# Patient Record
Sex: Male | Born: 1965 | Race: Black or African American | Hispanic: No | Marital: Single | Smoking: Never smoker
Health system: Southern US, Community
[De-identification: ages and names within clinical notes are randomized; demographics above are authoritative.]

## PROBLEM LIST (undated history)

## (undated) ENCOUNTER — Emergency Department (HOSPITAL_COMMUNITY): Payer: Self-pay | Source: Home / Self Care

## (undated) DIAGNOSIS — D759 Disease of blood and blood-forming organs, unspecified: Secondary | ICD-10-CM

## (undated) DIAGNOSIS — C61 Malignant neoplasm of prostate: Secondary | ICD-10-CM

## (undated) HISTORY — PX: PROSTATE SURGERY: SHX751

## (undated) HISTORY — PX: OTHER SURGICAL HISTORY: SHX169

## (undated) HISTORY — PX: KNEE ARTHROSCOPY: SUR90

---

## 1999-06-12 ENCOUNTER — Emergency Department (HOSPITAL_COMMUNITY): Admission: EM | Admit: 1999-06-12 | Discharge: 1999-06-12 | Payer: Self-pay | Admitting: Emergency Medicine

## 2002-01-20 ENCOUNTER — Emergency Department (HOSPITAL_COMMUNITY): Admission: EM | Admit: 2002-01-20 | Discharge: 2002-01-20 | Payer: Self-pay | Admitting: Emergency Medicine

## 2002-12-21 ENCOUNTER — Encounter: Payer: Self-pay | Admitting: Emergency Medicine

## 2002-12-21 ENCOUNTER — Emergency Department (HOSPITAL_COMMUNITY): Admission: EM | Admit: 2002-12-21 | Discharge: 2002-12-21 | Payer: Self-pay | Admitting: Emergency Medicine

## 2008-09-07 ENCOUNTER — Emergency Department (HOSPITAL_COMMUNITY): Admission: EM | Admit: 2008-09-07 | Discharge: 2008-09-07 | Payer: Self-pay | Admitting: Family Medicine

## 2010-06-11 LAB — POCT URINALYSIS DIP (DEVICE)
Bilirubin Urine: NEGATIVE
Glucose, UA: NEGATIVE mg/dL
Ketones, ur: NEGATIVE mg/dL
Nitrite: NEGATIVE
Protein, ur: NEGATIVE mg/dL
Specific Gravity, Urine: 1.025 (ref 1.005–1.030)
Urobilinogen, UA: 0.2 mg/dL (ref 0.0–1.0)
pH: 6 (ref 5.0–8.0)

## 2013-02-02 ENCOUNTER — Encounter (HOSPITAL_COMMUNITY): Payer: Self-pay | Admitting: Emergency Medicine

## 2013-02-02 ENCOUNTER — Emergency Department (HOSPITAL_COMMUNITY)
Admission: EM | Admit: 2013-02-02 | Discharge: 2013-02-02 | Disposition: A | Discharge status: Home / Self Care | Payer: BC Managed Care – PPO | Source: Home / Self Care

## 2013-02-02 DIAGNOSIS — N508 Other specified disorders of male genital organs: Secondary | ICD-10-CM

## 2013-02-02 DIAGNOSIS — N5089 Other specified disorders of the male genital organs: Secondary | ICD-10-CM

## 2013-02-02 NOTE — ED Notes (Signed)
Groin pain for one week.  Has back pain, but this is chronic back pain, not new.  Patient reports testicular swelling.  Denies painful urination

## 2013-02-02 NOTE — ED Provider Notes (Signed)
CSN: [id number]     Arrival date & time 02/02/13  1745 History   None    Chief Complaint  Patient presents with  . Groin Pain   (Consider location/radiation/quality/duration/timing/severity/associated sxs/prior Treatment) Patient is a 47 y.o. male presenting with groin pain. The history is provided by the patient.  Groin Pain This is a new problem. The current episode started more than 2 days ago (pt feels like his sx began after heavy lifting at work., not reported to [employer].). The problem has not changed since onset.Pertinent negatives include no chest pain and no abdominal pain.    History reviewed. No pertinent past medical history. History reviewed. No pertinent past surgical history. No family history on file. History  Substance Use Topics  . Smoking status: Never Smoker   . Smokeless tobacco: Not on file  . Alcohol Use: Yes    Review of Systems  Constitutional: Negative.   Cardiovascular: Negative for chest pain.  Gastrointestinal: Negative.  Negative for abdominal pain.  Genitourinary: Positive for scrotal swelling and testicular pain. Negative for dysuria, urgency, flank pain, discharge and penile pain.    Allergies  Review of patient's allergies indicates no known allergies.  Home Medications   Current Outpatient Rx  Name  Route  Sig  Dispense  Refill  . ibuprofen (ADVIL,MOTRIN) 200 MG tablet   Oral   Take 200 mg by mouth every 6 (six) hours as needed.          BP 153/92  Pulse 100  Temp(Src) 99.2 F (37.3 C) (Oral)  Resp 16  SpO2 100% Physical Exam  Nursing note and vitals reviewed. Constitutional: He is oriented to person, place, and time. He appears well-developed and well-nourished.  Abdominal: Soft. Bowel sounds are normal. Hernia confirmed negative in the right inguinal area and confirmed negative in the left inguinal area.  Genitourinary: Penis normal.    Right testis shows mass, swelling and tenderness. Left testis shows no mass, no  swelling and no tenderness. Circumcised.  Lymphadenopathy:       Right: No inguinal adenopathy present.       Left: No inguinal adenopathy present.  Neurological: He is alert and oriented to person, place, and time.  Skin: Skin is warm and dry.    ED Course  Procedures (including critical care time) Labs Review Labs Reviewed - No data to display Imaging Review No results found.  EKG Interpretation    Date/Time:    Ventricular Rate:    PR Interval:    QRS Duration:   QT Interval:    QTC Calculation:   R Axis:     Text Interpretation:              MDM  Scrotal mass needing urologic eval.    Linna Hoff, MD 02/02/13 2000

## 2013-02-06 ENCOUNTER — Other Ambulatory Visit (HOSPITAL_COMMUNITY): Payer: Self-pay | Admitting: Urology

## 2013-02-06 DIAGNOSIS — N5089 Other specified disorders of the male genital organs: Secondary | ICD-10-CM

## 2013-03-10 ENCOUNTER — Ambulatory Visit (HOSPITAL_COMMUNITY): Admission: RE | Admit: 2013-03-10 | Payer: BC Managed Care – PPO | Source: Ambulatory Visit

## 2014-10-13 ENCOUNTER — Emergency Department (HOSPITAL_COMMUNITY)
Admission: EM | Admit: 2014-10-13 | Discharge: 2014-10-13 | Disposition: A | Discharge status: Home / Self Care | Payer: BLUE CROSS/BLUE SHIELD | Attending: Emergency Medicine | Admitting: Emergency Medicine

## 2014-10-13 ENCOUNTER — Emergency Department (HOSPITAL_COMMUNITY): Payer: BLUE CROSS/BLUE SHIELD

## 2014-10-13 ENCOUNTER — Encounter (HOSPITAL_COMMUNITY): Payer: Self-pay | Admitting: General Surgery

## 2014-10-13 DIAGNOSIS — S71102A Unspecified open wound, left thigh, initial encounter: Secondary | ICD-10-CM | POA: Insufficient documentation

## 2014-10-13 DIAGNOSIS — Z23 Encounter for immunization: Secondary | ICD-10-CM | POA: Diagnosis not present

## 2014-10-13 DIAGNOSIS — Y999 Unspecified external cause status: Secondary | ICD-10-CM | POA: Diagnosis not present

## 2014-10-13 DIAGNOSIS — Y9289 Other specified places as the place of occurrence of the external cause: Secondary | ICD-10-CM | POA: Insufficient documentation

## 2014-10-13 DIAGNOSIS — Y9389 Activity, other specified: Secondary | ICD-10-CM | POA: Insufficient documentation

## 2014-10-13 DIAGNOSIS — Z720 Tobacco use: Secondary | ICD-10-CM | POA: Diagnosis not present

## 2014-10-13 DIAGNOSIS — W3400XA Accidental discharge from unspecified firearms or gun, initial encounter: Secondary | ICD-10-CM

## 2014-10-13 DIAGNOSIS — S71139A Puncture wound without foreign body, unspecified thigh, initial encounter: Secondary | ICD-10-CM

## 2014-10-13 LAB — PREPARE FRESH FROZEN PLASMA: Unit division: 0

## 2014-10-13 MED ORDER — HYDROCODONE-ACETAMINOPHEN 5-325 MG PO TABS: 2.0000 | ORAL_TABLET | ORAL | Status: DC | PRN

## 2014-10-13 MED ORDER — TETANUS-DIPHTH-ACELL PERTUSSIS 5-2.5-18.5 LF-MCG/0.5 IM SUSP
0.5000 mL | Freq: Once | INTRAMUSCULAR | Status: AC
  Administered 2014-10-13: 0.5 mL via INTRAMUSCULAR
  Filled 2014-10-13: qty 0.5

## 2014-10-13 MED ORDER — TETANUS-DIPHTH-ACELL PERTUSSIS 5-2.5-18.5 LF-MCG/0.5 IM SUSP: 0.5000 mL | Freq: Once | INTRAMUSCULAR | Status: DC

## 2014-10-13 MED ORDER — SODIUM CHLORIDE 0.9 % IV SOLN
INTRAVENOUS | Status: DC | PRN
  Administered 2014-10-13: 1000 mL via INTRAVENOUS

## 2014-10-13 MED ORDER — LIDOCAINE-EPINEPHRINE (PF) 2 %-1:200000 IJ SOLN
20.0000 mL | Freq: Once | INTRAMUSCULAR | Status: AC
  Administered 2014-10-13: 20 mL
  Filled 2014-10-13: qty 20
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
## 2014-10-13 NOTE — ED Notes (Signed)
Primary assessment completed on arrival at 20

## 2014-10-13 NOTE — ED Notes (Signed)
Assessment completed on arrival 1005
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
## 2014-10-13 NOTE — ED Notes (Signed)
Pt monitored by pulse ox, bp cuff, and 5-lead. 
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
## 2014-10-14 ENCOUNTER — Encounter (HOSPITAL_COMMUNITY): Payer: Self-pay | Admitting: Emergency Medicine

## 2014-10-20 ENCOUNTER — Emergency Department (HOSPITAL_COMMUNITY)
Admission: EM | Admit: 2014-10-20 | Discharge: 2014-10-20 | Disposition: A | Discharge status: Home / Self Care | Payer: Self-pay | Attending: Emergency Medicine | Admitting: Emergency Medicine

## 2014-10-20 ENCOUNTER — Encounter (HOSPITAL_COMMUNITY): Payer: Self-pay

## 2014-10-20 DIAGNOSIS — M7989 Other specified soft tissue disorders: Secondary | ICD-10-CM | POA: Insufficient documentation

## 2014-10-20 DIAGNOSIS — Z4802 Encounter for removal of sutures: Secondary | ICD-10-CM | POA: Insufficient documentation

## 2014-10-20 DIAGNOSIS — Z720 Tobacco use: Secondary | ICD-10-CM | POA: Insufficient documentation
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
## 2014-10-20 NOTE — ED Notes (Signed)
Pt left at this time with all belongings.  

## 2014-10-20 NOTE — Discharge Instructions (Signed)

## 2014-10-20 NOTE — ED Notes (Signed)
Pt states he is here for suture removal. Is concerned about swelling to R upper leg.

## 2014-10-20 NOTE — ED Notes (Signed)
  Suture removal kit at bedside 

## 2015-03-09 ENCOUNTER — Other Ambulatory Visit: Payer: Self-pay | Admitting: Gastroenterology

## 2015-03-16 ENCOUNTER — Encounter (HOSPITAL_COMMUNITY): Payer: Self-pay | Admitting: *Deleted

## 2015-03-24 ENCOUNTER — Encounter (HOSPITAL_COMMUNITY): Payer: Self-pay | Admitting: *Deleted

## 2015-03-24 ENCOUNTER — Ambulatory Visit (HOSPITAL_COMMUNITY): Payer: BLUE CROSS/BLUE SHIELD | Admitting: Certified Registered Nurse Anesthetist

## 2015-03-24 ENCOUNTER — Ambulatory Visit (HOSPITAL_COMMUNITY)
Admission: RE | Admit: 2015-03-24 | Discharge: 2015-03-24 | Disposition: A | Discharge status: Home / Self Care | Payer: BLUE CROSS/BLUE SHIELD | Source: Ambulatory Visit | Attending: Gastroenterology | Admitting: Gastroenterology

## 2015-03-24 ENCOUNTER — Encounter (HOSPITAL_COMMUNITY)
Admission: RE | Disposition: A | Discharge status: Home / Self Care | Payer: Self-pay | Source: Ambulatory Visit | Attending: Gastroenterology

## 2015-03-24 DIAGNOSIS — Z1211 Encounter for screening for malignant neoplasm of colon: Secondary | ICD-10-CM | POA: Diagnosis present

## 2015-03-24 HISTORY — PX: COLONOSCOPY WITH PROPOFOL: SHX5780

## 2015-03-24 HISTORY — DX: Disease of blood and blood-forming organs, unspecified: D75.9

## 2015-03-24 SURGERY — COLONOSCOPY WITH PROPOFOL
Anesthesia: Monitor Anesthesia Care

## 2015-03-24 MED ORDER — PROPOFOL 10 MG/ML IV BOLUS
INTRAVENOUS | Status: AC
  Filled 2015-03-24: qty 20

## 2015-03-24 MED ORDER — ONDANSETRON HCL 4 MG/2ML IJ SOLN
INTRAMUSCULAR | Status: AC
  Filled 2015-03-24: qty 2

## 2015-03-24 MED ORDER — LACTATED RINGERS IV SOLN
INTRAVENOUS | Status: DC | PRN
  Administered 2015-03-24: 11:00:00 via INTRAVENOUS

## 2015-03-24 MED ORDER — ONDANSETRON HCL 4 MG/2ML IJ SOLN
INTRAMUSCULAR | Status: DC | PRN
  Administered 2015-03-24: 4 mg via INTRAVENOUS

## 2015-03-24 MED ORDER — SODIUM CHLORIDE 0.9 % IV SOLN: INTRAVENOUS | Status: DC

## 2015-03-24 MED ORDER — PROPOFOL 500 MG/50ML IV EMUL
INTRAVENOUS | Status: DC | PRN
  Administered 2015-03-24: 300 ug/kg/min via INTRAVENOUS

## 2015-03-24 MED ORDER — PROPOFOL 10 MG/ML IV BOLUS
INTRAVENOUS | Status: AC
  Filled 2015-03-24: qty 40

## 2015-03-24 SURGICAL SUPPLY — 22 items

## 2015-03-24 NOTE — Anesthesia Preprocedure Evaluation (Signed)
Anesthesia Evaluation  Patient identified by MRN, date of birth, ID band Patient awake    Reviewed: Allergy & Precautions, NPO status , Patient's Chart, lab work & pertinent test results  Airway Mallampati: II  TM Distance: >3 FB Neck ROM: Full    Dental no notable dental hx.    Pulmonary neg pulmonary ROS,    Pulmonary exam normal breath sounds clear to auscultation       Cardiovascular Exercise Tolerance: Good negative cardio ROS Normal cardiovascular exam Rhythm:Regular Rate:Normal     Neuro/Psych negative neurological ROS  negative psych ROS   GI/Hepatic negative GI ROS, Neg liver ROS,   Endo/Other  negative endocrine ROS  Renal/GU negative Renal ROS  negative genitourinary   Musculoskeletal negative musculoskeletal ROS (+)   Abdominal   Peds negative pediatric ROS (+)  Hematology  (+) Blood dyscrasia, ,   Anesthesia Other Findings   Reproductive/Obstetrics negative OB ROS                             Anesthesia Physical Anesthesia Plan  ASA: II  Anesthesia Plan: MAC   Post-op Pain Management:    Induction: Intravenous  Airway Management Planned: Natural Airway  Additional Equipment:   Intra-op Plan:   Post-operative Plan:   Informed Consent: I have reviewed the patients History and Physical, chart, labs and discussed the procedure including the risks, benefits and alternatives for the proposed anesthesia with the patient or authorized representative who has indicated his/her understanding and acceptance.   Dental advisory given  Plan Discussed with: CRNA  Anesthesia Plan Comments:         Anesthesia Quick Evaluation

## 2015-03-24 NOTE — Discharge Instructions (Signed)

## 2015-03-24 NOTE — Anesthesia Postprocedure Evaluation (Signed)
Anesthesia Post Note  Patient: Bryan Chambers  Procedure(s) Performed: Procedure(s) (LRB): COLONOSCOPY WITH PROPOFOL (N/A)  Patient location during evaluation: PACU Anesthesia Type: MAC Level of consciousness: awake and alert Pain management: pain level controlled Vital Signs Assessment: post-procedure vital signs reviewed and stable Respiratory status: spontaneous breathing, nonlabored ventilation, respiratory function stable and patient connected to nasal cannula oxygen Cardiovascular status: stable and blood pressure returned to baseline Anesthetic complications: no    Last Vitals:  Filed Vitals:   03/24/15 1201 03/24/15 1220  BP: 94/67 153/95  Pulse: 63   Temp: 36.4 C   Resp: 18     Last Pain: There were no vitals filed for this visit.               Jaydence Vanyo J
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
## 2015-03-24 NOTE — Op Note (Signed)
Procedure: Baseline screening colonoscopy  Endoscopist: Martin Johnson  Premedication: Propofol administered by anesthesia  Procedure: The patient was placed in the left lateral decubitus position. Anal inspection and digital rectal exam were normal. The Pentax pediatric colonoscope was introduced into the rectum and advanced to the cecum. A normal-appearing appendiceal orifice was identified. A normal-appearing ileocecal valve was intubated and the terminal ileum inspected. Colonic preparation for the exam today was good. Withdrawal time was 9 minutes  Rectum. Normal. Retroflexed view of the distal rectum was normal  Sigmoid colon and descending colon. Normal  Splenic flexure. Normal  Transverse colon. Normal  Hepatic flexure. Normal  Ascending colon. Normal  Cecum and ileocecal valve. Normal  Terminal ileum. Normal  Assessment: Normal screening colonoscopy  Recommendation: Schedule repeat screening colonoscopy in 10 years 

## 2015-03-24 NOTE — Transfer of Care (Signed)
Immediate Anesthesia Transfer of Care Note  Patient: Bryan Chambers  Procedure(s) Performed: Procedure(s): COLONOSCOPY WITH PROPOFOL (N/A)  Patient Location: PACU  Anesthesia Type:MAC  Level of Consciousness:  sedated, patient cooperative and responds to stimulation  Airway & Oxygen Therapy:Patient Spontanous Breathing and Patient connected to face mask oxgen  Post-op Assessment:  Report given to PACU RN and Post -op Vital signs reviewed and stable  Post vital signs:  Reviewed and stable  Last Vitals:  Filed Vitals:   03/24/15 1100  BP: 152/100  Pulse: 67  Temp: 36.7 C  Resp: 17    Complications: No apparent anesthesia complications

## 2015-03-25 ENCOUNTER — Encounter (HOSPITAL_COMMUNITY): Payer: Self-pay | Admitting: Gastroenterology

## 2015-04-13 ENCOUNTER — Other Ambulatory Visit: Payer: Self-pay | Admitting: Urology

## 2015-06-09 ENCOUNTER — Other Ambulatory Visit (HOSPITAL_COMMUNITY): Payer: BLUE CROSS/BLUE SHIELD

## 2015-06-10 ENCOUNTER — Inpatient Hospital Stay: Admit: 2015-06-10 | Payer: BLUE CROSS/BLUE SHIELD | Admitting: Urology

## 2015-06-10 SURGERY — PROSTATECTOMY, RADICAL, ROBOT-ASSISTED, LAPAROSCOPIC
Anesthesia: General

## 2015-08-30 ENCOUNTER — Emergency Department (HOSPITAL_COMMUNITY)
Admission: EM | Admit: 2015-08-30 | Discharge: 2015-08-30 | Disposition: A | Discharge status: Home / Self Care | Payer: BLUE CROSS/BLUE SHIELD | Attending: Emergency Medicine | Admitting: Emergency Medicine

## 2015-08-30 ENCOUNTER — Ambulatory Visit (HOSPITAL_COMMUNITY)
Admission: EM | Admit: 2015-08-30 | Discharge: 2015-08-30 | Disposition: A | Discharge status: Home / Self Care | Payer: BLUE CROSS/BLUE SHIELD

## 2015-08-30 ENCOUNTER — Encounter (HOSPITAL_COMMUNITY): Payer: Self-pay | Admitting: Nurse Practitioner

## 2015-08-30 DIAGNOSIS — Y733 Surgical instruments, materials and gastroenterology and urology devices (including sutures) associated with adverse incidents: Secondary | ICD-10-CM | POA: Insufficient documentation

## 2015-08-30 DIAGNOSIS — T83098A Other mechanical complication of other indwelling urethral catheter, initial encounter: Secondary | ICD-10-CM | POA: Insufficient documentation

## 2015-08-30 DIAGNOSIS — Z79899 Other long term (current) drug therapy: Secondary | ICD-10-CM | POA: Diagnosis not present

## 2015-08-30 DIAGNOSIS — Z8546 Personal history of malignant neoplasm of prostate: Secondary | ICD-10-CM | POA: Insufficient documentation

## 2015-08-30 DIAGNOSIS — T839XXA Unspecified complication of genitourinary prosthetic device, implant and graft, initial encounter: Secondary | ICD-10-CM

## 2015-08-30 HISTORY — DX: Malignant neoplasm of prostate: C61
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
## 2015-08-30 NOTE — ED Notes (Signed)
Pt was at [facility] on 6/21 for prostate surgery. He had a foley catheter placed during procedure. Ttoday he noticed a pulling sensation around the catheter. The catheter is still in with bag attached to leg but he is not sure if it is in place. There is no urine leaking around.

## 2015-08-30 NOTE — Discharge Instructions (Signed)
Foley Catheter Care, Adult °A Foley catheter is a soft, flexible tube that is placed into the bladder to drain urine. A Foley catheter may be inserted if: °· You leak urine or are not able to control when you urinate (urinary incontinence). °· You are not able to urinate when you need to (urinary retention). °· You had prostate surgery or surgery on the genitals. °· You have certain medical conditions, such as multiple sclerosis, dementia, or a spinal cord injury. °If you are going home with a Foley catheter in place, follow the instructions below. °TAKING CARE OF THE CATHETER °1. Wash your hands with soap and water. °2. Using mild soap and warm water on a clean washcloth: °¨ Clean the area on your body closest to the catheter insertion site using a circular motion, moving away from the catheter. Never wipe toward the catheter because this could sweep bacteria up into the urethra and cause infection. °¨ Remove all traces of soap. Pat the area dry with a clean towel. For males, reposition the foreskin. °3. Attach the catheter to your leg so there is no tension on the catheter. Use adhesive tape or a leg strap. If you are using adhesive tape, remove any sticky residue left behind by the previous tape you used. °4. Keep the drainage bag below the level of the bladder, but keep it off the floor. °5. Check throughout the day to be sure the catheter is working and urine is draining freely. Make sure the tubing does not become kinked. °6. Do not pull on the catheter or try to remove it. Pulling could damage internal tissues. °TAKING CARE OF THE DRAINAGE BAGS °You will be given two drainage bags to take home. One is a large overnight drainage bag, and the other is a smaller leg bag that fits underneath clothing. You may wear the overnight bag at any time, but you should never wear the smaller leg bag at night. Follow the instructions below for how to empty, change, and clean your drainage bags. °Emptying the Drainage  Bag °You must empty your drainage bag when it is  -½ full or at least 2-3 times a day. °1. Wash your hands with soap and water. °2. Keep the drainage bag below your hips, below the level of your bladder. This stops urine from going back into the tubing and into your bladder. °3. Hold the dirty bag over the toilet or a clean container. °4. Open the pour spout at the bottom of the bag and empty the urine into the toilet or container. Do not let the pour spout touch the toilet, container, or any other surface. Doing so can place bacteria on the bag, which can cause an infection. °5. Clean the pour spout with a gauze pad or cotton ball that has rubbing alcohol on it. °6. Close the pour spout. °7. Attach the bag to your leg with adhesive tape or a leg strap. °8. Wash your hands well. °Changing the Drainage Bag °Change your drainage bag once a month or sooner if it starts to smell bad or look dirty. Below are steps to follow when changing the drainage bag. °1. Wash your hands with soap and water. °2. Pinch off the rubber catheter so that urine does not spill out. °3. Disconnect the catheter tube from the drainage tube at the connection valve. Do not let the tubes touch any surface. °4. Clean the end of the catheter tube with an alcohol wipe. Use a different alcohol wipe to clean   the end of the drainage tube. 5. Connect the catheter tube to the drainage tube of the clean drainage bag. 6. Attach the new bag to the leg with adhesive tape or a leg strap. Avoid attaching the new bag too tightly. 7. Wash your hands well. Cleaning the Drainage Bag 1. Wash your hands with soap and water. 2. Wash the bag in warm, soapy water. 3. Rinse the bag thoroughly with warm water. 4. Fill the bag with a solution of white vinegar and water (1 cup vinegar to 1 qt warm water [.2 L vinegar to 1 L warm water]). Close the bag and soak it for 30 minutes in the solution. 5. Rinse the bag with warm water. 6. Hang the bag to dry with the  pour spout open and hanging downward. 7. Store the clean bag (once it is dry) in a clean plastic bag. 8. Wash your hands well. PREVENTING INFECTION  Wash your hands before and after handling your catheter.  Take showers daily and wash the area where the catheter enters your body. Do not take baths. Replace wet leg straps with dry ones, if this applies.  Do not use powders, sprays, or lotions on the genital area. Only use creams, lotions, or ointments as directed by your caregiver.  For females, wipe from front to back after each bowel movement.  Drink enough fluids to keep your urine clear or pale yellow unless you have a fluid restriction.  Do not let the drainage bag or tubing touch or lie on the floor.  Wear cotton underwear to absorb moisture and to keep your skin drier. SEEK MEDICAL CARE IF:   Your urine is cloudy or smells unusually bad.  Your catheter becomes clogged.  You are not draining urine into the bag or your bladder feels full.  Your catheter starts to leak. SEEK IMMEDIATE MEDICAL CARE IF:   You have pain, swelling, redness, or pus where the catheter enters the body.  You have pain in the abdomen, legs, lower back, or bladder.  You have a fever.  You see blood fill the catheter, or your urine is pink or red.  You have nausea, vomiting, or chills.  Your catheter gets pulled out completely. MAKE SURE YOU:   Understand these instructions.  Will watch your condition.  Will get help right away if you are not doing well or get worse.   This information is not intended to replace advice given to you by your health care provider. Make sure you discuss any questions you have with your health care provider.   Document Released: 02/19/2005 Document Revised: 07/06/2013 Document Reviewed: 02/11/2012 Elsevier Interactive Patient Education [employer].

## 2015-08-30 NOTE — ED Notes (Signed)
Pt ambulates independently and with steady gait at time of discharge. Discharge instructions and follow up information reviewed with patient. No other questions or concerns voiced at this time.  

## 2015-08-31 ENCOUNTER — Telehealth (HOSPITAL_BASED_OUTPATIENT_CLINIC_OR_DEPARTMENT_OTHER): Payer: Self-pay | Admitting: Emergency Medicine

## 2016-10-25 ENCOUNTER — Other Ambulatory Visit (HOSPITAL_COMMUNITY): Payer: Self-pay | Admitting: Orthopedic Surgery

## 2016-10-25 ENCOUNTER — Ambulatory Visit (HOSPITAL_COMMUNITY)
Admission: RE | Admit: 2016-10-25 | Discharge: 2016-10-25 | Disposition: A | Discharge status: Home / Self Care | Payer: BLUE CROSS/BLUE SHIELD | Source: Ambulatory Visit | Attending: Orthopedic Surgery | Admitting: Orthopedic Surgery

## 2016-10-25 DIAGNOSIS — M79604 Pain in right leg: Secondary | ICD-10-CM

## 2016-10-25 DIAGNOSIS — M7989 Other specified soft tissue disorders: Secondary | ICD-10-CM | POA: Insufficient documentation

## 2016-10-25 NOTE — Progress Notes (Signed)
**  Preliminary report by tech**  Right lower extremity venous duplex complete. There is no evidence of deep or superficial vein thrombosis involving the right lower extremity. All visualized vessels appear patent and compressible.  Incidental findings are consistent with a Baker's Cyst measuring 2.8 cm high by 3.7 cm wide by greater than 4.9 cm long on the right. Results were given to Dr. French Ana.  10/25/16 3:49 PM Bryan Chambers RVT

## 2016-10-26 ENCOUNTER — Encounter (HOSPITAL_COMMUNITY): Payer: BLUE CROSS/BLUE SHIELD

## 2020-01-11 ENCOUNTER — Encounter: Payer: Self-pay | Admitting: Emergency Medicine

## 2020-01-11 ENCOUNTER — Other Ambulatory Visit: Payer: Self-pay

## 2020-01-11 ENCOUNTER — Emergency Department
Admission: EM | Admit: 2020-01-11 | Discharge: 2020-01-11 | Disposition: A | Discharge status: Home / Self Care | Payer: Self-pay | Attending: Emergency Medicine | Admitting: Emergency Medicine

## 2020-01-11 ENCOUNTER — Emergency Department: Payer: Self-pay

## 2020-01-11 DIAGNOSIS — Z8546 Personal history of malignant neoplasm of prostate: Secondary | ICD-10-CM | POA: Insufficient documentation

## 2020-01-11 DIAGNOSIS — K4091 Unilateral inguinal hernia, without obstruction or gangrene, recurrent: Secondary | ICD-10-CM | POA: Insufficient documentation

## 2020-01-11 LAB — COMPREHENSIVE METABOLIC PANEL
ALT: 58 U/L — ABNORMAL HIGH (ref 0–44)
AST: 61 U/L — ABNORMAL HIGH (ref 15–41)
Albumin: 3.8 g/dL (ref 3.5–5.0)
Alkaline Phosphatase: 60 U/L (ref 38–126)
Anion gap: 9 (ref 5–15)
BUN: 15 mg/dL (ref 6–20)
CO2: 23 mmol/L (ref 22–32)
Calcium: 9.3 mg/dL (ref 8.9–10.3)
Chloride: 102 mmol/L (ref 98–111)
Creatinine, Ser: 1.07 mg/dL (ref 0.61–1.24)
GFR, Estimated: >60 mL/min (ref >60)
Glucose, Bld: 98 mg/dL (ref 70–99)
Potassium: 3.6 mmol/L (ref 3.5–5.1)
Sodium: 134 mmol/L — ABNORMAL LOW (ref 135–145)
Total Bilirubin: 1.1 mg/dL (ref 0.3–1.2)
Total Protein: 7.4 g/dL (ref 6.5–8.1)

## 2020-01-11 LAB — CBC
HCT: 36.1 % — ABNORMAL LOW (ref 39.0–52.0)
Hemoglobin: 12.5 g/dL — ABNORMAL LOW (ref 13.0–17.0)
MCH: 31 pg (ref 26.0–34.0)
MCHC: 34.6 g/dL (ref 30.0–36.0)
MCV: 89.6 fL (ref 80.0–100.0)
Platelets: 270 10*3/uL (ref 150–400)
RBC: 4.03 MIL/uL — ABNORMAL LOW (ref 4.22–5.81)
RDW: 15.3 % (ref 11.5–15.5)
WBC: 6 10*3/uL (ref 4.0–10.5)
nRBC: 0 % (ref 0.0–0.2)

## 2020-01-11 LAB — URINALYSIS, COMPLETE (UACMP) WITH MICROSCOPIC
Bilirubin Urine: NEGATIVE
Glucose, UA: NEGATIVE mg/dL
Ketones, ur: NEGATIVE mg/dL
Nitrite: POSITIVE — AB
Protein, ur: NEGATIVE mg/dL
Specific Gravity, Urine: 1.027 (ref 1.005–1.030)
pH: 5 (ref 5.0–8.0)

## 2020-01-11 LAB — LIPASE, BLOOD: Lipase: 26 U/L (ref 11–51)

## 2020-01-11 MED ORDER — IOHEXOL 300 MG/ML  SOLN
100.0000 mL | Freq: Once | INTRAMUSCULAR | Status: AC | PRN
  Administered 2020-01-11: 100 mL via INTRAVENOUS

## 2020-01-11 NOTE — ED Triage Notes (Addendum)
Pt states that he has a hernia in upper right groin that was supposed to be repaired last month, but has been unable to.   Pt in 5/10 pain at this time. Pt denies N/V/D at this time.  Pt has hx of prostate cancer
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
## 2020-01-11 NOTE — Discharge Instructions (Signed)
Please return to the ER for reevaluation if you develop worsening pain in the right lower portion of your abdomen, fever, vomiting, or swelling in the right side of your groin that you cannot push down.  Otherwise, you should schedule follow-up with a general surgeon as well as with [facility].

## 2020-01-11 NOTE — ED Notes (Signed)
ED Provider at bedside. 

## 2020-01-11 NOTE — ED Notes (Signed)
Patient transported to CT 

## 2020-01-13 LAB — URINE CULTURE: Culture: >=100000 — AB

## 2020-01-14 LAB — URINE CULTURE

## 2020-01-15 NOTE — Progress Notes (Signed)
ED Antimicrobial Stewardship Positive Culture Follow Up   Bryan Chambers is an 54 y.o. male who presented to [facility] on 01/11/2020 with a chief complaint of right groin pain. CT scan showed small right inguinal hernia with no evidence of obstruction or strangulation. Patient denied any urinary symptoms. When patient was in ED, physician at the time decided to hold off on treatment since patient denied symptoms. Discussed with ED provider - agreed patient does not need antibiotics.    Chief Complaint  Patient presents with  . Abdominal Pain    Recent Results (from the past 720 hour(s))  Urine culture     Status: Abnormal   Collection Time: 01/11/20  4:04 PM   Specimen: Urine, Random  Result Value Ref Range Status   Specimen Description   Final    URINE, RANDOM Performed at [facility], [address], [place], [place] [address]    Special Requests   Final    NONE Performed at [facility], [address] [place], [place] [address]    Culture >=100,000 COLONIES/mL ESCHERICHIA COLI (A)  Final   Report Status 01/14/2020 FINAL  Final   Organism ID, Bacteria ESCHERICHIA COLI (A)  Final      Susceptibility   Escherichia coli - MIC*    AMPICILLIN <=2 SENSITIVE Sensitive     CEFAZOLIN <=4 SENSITIVE Sensitive     CEFEPIME <=0.12 SENSITIVE Sensitive     CEFTRIAXONE <=0.25 SENSITIVE Sensitive     CIPROFLOXACIN <=0.25 SENSITIVE Sensitive     GENTAMICIN <=1 SENSITIVE Sensitive     IMIPENEM <=0.25 SENSITIVE Sensitive     NITROFURANTOIN <=16 SENSITIVE Sensitive     TRIMETH/SULFA <=20 SENSITIVE Sensitive     AMPICILLIN/SULBACTAM <=2 SENSITIVE Sensitive     PIP/TAZO <=4 SENSITIVE Sensitive     * >=100,000 COLONIES/mL ESCHERICHIA COLI    [x]  Patient discharged originally without antimicrobial agent and treatment is not indicated  ED Provider: Dr. Waldon Merl, PharmD Pharmacy Resident  01/15/2020 9:44 AM

## 2020-02-09 ENCOUNTER — Ambulatory Visit (HOSPITAL_COMMUNITY)
Admission: EM | Admit: 2020-02-09 | Discharge: 2020-02-09 | Disposition: A | Discharge status: Home / Self Care | Payer: Self-pay | Attending: Emergency Medicine | Admitting: Emergency Medicine

## 2020-02-09 ENCOUNTER — Encounter (HOSPITAL_COMMUNITY): Payer: Self-pay

## 2020-02-09 DIAGNOSIS — S01511A Laceration without foreign body of lip, initial encounter: Secondary | ICD-10-CM

## 2020-02-09 NOTE — ED Notes (Signed)
Irrigated mouth lacerations/wounds with sterile water/ soap and rinsed with sterile water using irrigation syringe and cotton tip applicators. Pt declined dressing application 2/2 difficulty adhering dressing to lip area. Re-iterated to pt need to contact plastics/ENT STAT for wound eval.
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
## 2020-02-09 NOTE — Discharge Instructions (Addendum)
Due to the age and location of this wound I would like you to follow up with either ENT or plastic surgery, call now and notify them of your laceration which crosses the [place] border, for definitive treatment.

## 2020-06-02 ENCOUNTER — Encounter (HOSPITAL_COMMUNITY): Payer: Self-pay | Admitting: Emergency Medicine

## 2020-06-02 ENCOUNTER — Emergency Department (HOSPITAL_COMMUNITY)
Admission: EM | Admit: 2020-06-02 | Discharge: 2020-06-02 | Disposition: A | Discharge status: Home / Self Care | Payer: Self-pay | Attending: Emergency Medicine | Admitting: Emergency Medicine

## 2020-06-02 DIAGNOSIS — M549 Dorsalgia, unspecified: Secondary | ICD-10-CM

## 2020-06-02 DIAGNOSIS — Z8546 Personal history of malignant neoplasm of prostate: Secondary | ICD-10-CM | POA: Insufficient documentation

## 2020-06-02 DIAGNOSIS — Y92410 Unspecified street and highway as the place of occurrence of the external cause: Secondary | ICD-10-CM | POA: Insufficient documentation

## 2020-06-02 DIAGNOSIS — M542 Cervicalgia: Secondary | ICD-10-CM

## 2020-06-02 MED ORDER — METHOCARBAMOL 500 MG PO TABS: 500.0000 mg | ORAL_TABLET | Freq: Every evening | ORAL | 0 refills | Status: DC | PRN

## 2020-06-02 MED ORDER — NAPROXEN 500 MG PO TABS: 500.0000 mg | ORAL_TABLET | Freq: Two times a day (BID) | ORAL | 0 refills | Status: DC

## 2020-06-02 NOTE — Discharge Instructions (Addendum)
Take naproxen 2 times a day with meals.  Do not take other anti-inflammatories at the same time (Advil, Motrin, ibuprofen, Aleve). You may supplement with Tylenol if you need further pain control. Use robaxin as needed for muscle stiffness or soreness.  Have caution, this may make you tired or groggy.  Do not drive or operate heavy machinery while taking this medicine. Use ice packs or heating pads if this helps control your pain. Use muscle creams/patches as needed for pain control. You will likely have continued muscle stiffness and soreness over the next couple days.  Follow-up with primary care in 1 week if your symptoms are not improving. Return to the emergency room if you develop vision changes, vomiting, slurred speech, numbness, loss of bowel or bladder control, or any new or worsening symptoms.
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
## 2020-11-14 ENCOUNTER — Emergency Department (HOSPITAL_BASED_OUTPATIENT_CLINIC_OR_DEPARTMENT_OTHER)
Admission: EM | Admit: 2020-11-14 | Discharge: 2020-11-14 | Disposition: A | Discharge status: Home / Self Care | Payer: 59 | Attending: Emergency Medicine | Admitting: Emergency Medicine

## 2020-11-14 ENCOUNTER — Encounter (HOSPITAL_BASED_OUTPATIENT_CLINIC_OR_DEPARTMENT_OTHER): Payer: Self-pay

## 2020-11-14 ENCOUNTER — Other Ambulatory Visit: Payer: Self-pay

## 2020-11-14 DIAGNOSIS — Z8546 Personal history of malignant neoplasm of prostate: Secondary | ICD-10-CM | POA: Insufficient documentation

## 2020-11-14 DIAGNOSIS — R2241 Localized swelling, mass and lump, right lower limb: Secondary | ICD-10-CM | POA: Diagnosis not present

## 2020-11-14 DIAGNOSIS — M25562 Pain in left knee: Secondary | ICD-10-CM | POA: Diagnosis present

## 2020-11-14 MED ORDER — HYDROCODONE-ACETAMINOPHEN 5-325 MG PO TABS: 1.0000 | ORAL_TABLET | Freq: Four times a day (QID) | ORAL | 0 refills | Status: DC | PRN

## 2020-11-14 MED ORDER — HYDROCODONE-ACETAMINOPHEN 5-325 MG PO TABS
1.0000 | ORAL_TABLET | Freq: Once | ORAL | Status: AC
  Administered 2020-11-14: 1 via ORAL
  Filled 2020-11-14: qty 1

## 2020-11-14 MED ORDER — PREDNISONE 10 MG (21) PO TBPK: ORAL_TABLET | ORAL | 0 refills | Status: DC

## 2020-11-14 MED ORDER — COLCHICINE 0.6 MG PO TABS: ORAL_TABLET | ORAL | 0 refills | Status: DC

## 2020-11-14 NOTE — ED Notes (Signed)
Pt with knee pain x 2 weeks. Denies any injury or trauma. Pt states the swelling has decreased some. He has been taking nsaids and icing it with no relief. Pt alert & oriented, nad noted.
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
## 2021-01-20 ENCOUNTER — Emergency Department (HOSPITAL_BASED_OUTPATIENT_CLINIC_OR_DEPARTMENT_OTHER)
Admission: EM | Admit: 2021-01-20 | Discharge: 2021-01-20 | Disposition: A | Discharge status: Home / Self Care | Payer: 59 | Attending: Emergency Medicine | Admitting: Emergency Medicine

## 2021-01-20 ENCOUNTER — Emergency Department (HOSPITAL_BASED_OUTPATIENT_CLINIC_OR_DEPARTMENT_OTHER): Payer: 59

## 2021-01-20 ENCOUNTER — Other Ambulatory Visit: Payer: Self-pay

## 2021-01-20 DIAGNOSIS — M25462 Effusion, left knee: Secondary | ICD-10-CM | POA: Diagnosis not present

## 2021-01-20 DIAGNOSIS — Z8546 Personal history of malignant neoplasm of prostate: Secondary | ICD-10-CM | POA: Insufficient documentation

## 2021-01-20 DIAGNOSIS — M25562 Pain in left knee: Secondary | ICD-10-CM

## 2021-01-20 MED ORDER — KETOROLAC TROMETHAMINE 15 MG/ML IJ SOLN
15.0000 mg | Freq: Once | INTRAMUSCULAR | Status: AC
  Administered 2021-01-20: 15 mg via INTRAMUSCULAR
  Filled 2021-01-20: qty 1

## 2021-01-20 MED ORDER — ETODOLAC 300 MG PO CAPS: 300.0000 mg | ORAL_CAPSULE | Freq: Two times a day (BID) | ORAL | 0 refills | Status: AC

## 2021-01-20 NOTE — ED Notes (Signed)
Pt d/c home per MD order. Discharge summary reviewed with pt, pt verbalizes understanding. Ambulatory off unit. No s/s of acute distress noted at discharge.,  °
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
## 2021-01-20 NOTE — Discharge Instructions (Signed)
Take etodolac as directed   Follow up with orthopedics  Please return to the emergency department for any new or worsening symptoms.

## 2021-01-20 NOTE — ED Triage Notes (Incomplete)
Pt c/o left knee pain x2 months. Seen here previously for same, dx with gout. Reports no alleviating factors. Reports gout meds prescribed previously did not help. Denies injury or recent falls to area.

## 2021-03-16 ENCOUNTER — Other Ambulatory Visit: Payer: Self-pay | Admitting: Family Medicine

## 2021-03-16 ENCOUNTER — Ambulatory Visit (INDEPENDENT_AMBULATORY_CARE_PROVIDER_SITE_OTHER): Payer: 59

## 2021-03-16 ENCOUNTER — Ambulatory Visit: Payer: 59 | Admitting: Family Medicine

## 2021-03-16 ENCOUNTER — Ambulatory Visit (INDEPENDENT_AMBULATORY_CARE_PROVIDER_SITE_OTHER): Payer: 59 | Admitting: Family Medicine

## 2021-03-16 ENCOUNTER — Ambulatory Visit: Payer: Self-pay

## 2021-03-16 ENCOUNTER — Ambulatory Visit: Payer: 59

## 2021-03-16 ENCOUNTER — Other Ambulatory Visit: Payer: Self-pay

## 2021-03-16 VITALS — BP 162/98 | HR 95 | Ht 69.0 in | Wt 189.6 lb

## 2021-03-16 DIAGNOSIS — M25551 Pain in right hip: Secondary | ICD-10-CM

## 2021-03-16 DIAGNOSIS — M25562 Pain in left knee: Secondary | ICD-10-CM | POA: Diagnosis not present

## 2021-03-16 DIAGNOSIS — G8929 Other chronic pain: Secondary | ICD-10-CM

## 2021-03-16 LAB — BASIC METABOLIC PANEL
BUN: 8 mg/dL (ref 6–23)
CO2: 27 mEq/L (ref 19–32)
Calcium: 9.6 mg/dL (ref 8.4–10.5)
Chloride: 104 mEq/L (ref 96–112)
Creatinine, Ser: 1.24 mg/dL (ref 0.40–1.50)
GFR: 64.6 mL/min (ref >60.00)
Glucose, Bld: 92 mg/dL (ref 70–99)
Potassium: 3.9 mEq/L (ref 3.5–5.1)
Sodium: 138 mEq/L (ref 135–145)

## 2021-03-16 LAB — URIC ACID: Uric Acid, Serum: 8 mg/dL — ABNORMAL HIGH (ref 4.0–7.8)

## 2021-03-16 MED ORDER — COLCHICINE 0.6 MG PO TABS: 0.6000 mg | ORAL_TABLET | Freq: Every day | ORAL | 1 refills | Status: DC

## 2021-03-16 NOTE — Patient Instructions (Addendum)
Thank you for coming in today.   Please get an Xray today before you leave   Please get labs today before you leave   You received an injection today. Seek immediate medical attention if the joint becomes red, extremely painful, or is oozing fluid.   I've sent the colchicine into your pharmacy.  Please complete the exercises that the athletic trainer went over with you:  View at [email or web address] using code: [email or web address]
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
## 2021-03-17 MED ORDER — ALLOPURINOL 300 MG PO TABS: 300.0000 mg | ORAL_TABLET | Freq: Every day | ORAL | 3 refills | Status: AC

## 2021-03-17 NOTE — Progress Notes (Signed)
Uric acid level is 8.0.  This is elevated indicating gout.  Starting allopurinol.  Take this medication daily to prevent future gout episodes.

## 2021-03-17 NOTE — Addendum Note (Signed)
Addended by: Gregor Hams on: 03/17/2021 07:45 AM   Modules accepted: Orders

## 2021-03-17 NOTE — Progress Notes (Signed)
Right hip x-ray shows some severe arthritis changes of the hip joint.  This is causing some of the hip pain.  The exercises that we taught you could help as well.  If not improved hip replacement may be helpful.

## 2021-03-20 NOTE — Progress Notes (Signed)
Fluid analysis of the fluid pulled off of your knee indicates that it probably was not gout causing your knee pain.

## 2021-03-22 LAB — ANAEROBIC AND AEROBIC CULTURE
AER RESULT:: NO GROWTH
GRAM STAIN:: NONE SEEN
MICRO NUMBER:: 12863109
MICRO NUMBER:: 12863110
SPECIMEN QUALITY:: ADEQUATE
SPECIMEN QUALITY:: ADEQUATE

## 2021-03-22 LAB — SYNOVIAL FLUID ANALYSIS, COMPLETE
Basophils, %: 0 %
Eosinophils-Synovial: 0 % (ref 0–2)
Lymphocytes-Synovial Fld: 90 % — ABNORMAL HIGH (ref 0–74)
Monocyte/Macrophage: 8 % (ref 0–69)
Neutrophil, Synovial: 2 % (ref 0–24)
Synoviocytes, %: 0 % (ref 0–15)
WBC, Synovial: 467 cells/uL — ABNORMAL HIGH (ref <150)

## 2021-04-05 ENCOUNTER — Other Ambulatory Visit: Payer: Self-pay

## 2021-04-05 ENCOUNTER — Ambulatory Visit (INDEPENDENT_AMBULATORY_CARE_PROVIDER_SITE_OTHER): Payer: 59

## 2021-04-05 ENCOUNTER — Ambulatory Visit: Payer: Self-pay

## 2021-04-05 ENCOUNTER — Ambulatory Visit (INDEPENDENT_AMBULATORY_CARE_PROVIDER_SITE_OTHER): Payer: 59 | Admitting: Family Medicine

## 2021-04-05 VITALS — BP 150/90 | HR 83 | Ht 69.0 in | Wt 180.6 lb

## 2021-04-05 DIAGNOSIS — G8929 Other chronic pain: Secondary | ICD-10-CM | POA: Diagnosis not present

## 2021-04-05 DIAGNOSIS — M25521 Pain in right elbow: Secondary | ICD-10-CM

## 2021-04-05 DIAGNOSIS — M25551 Pain in right hip: Secondary | ICD-10-CM | POA: Diagnosis not present

## 2021-04-05 NOTE — Progress Notes (Signed)
I, Peterson Lombard, LAT, ATC acting as a scribe for Lynne Leader, MD.  Bryan Chambers is a 56 y.o. male who presents to [place] [facility] today for f/u L knee pain due to DJD and gout and R hip pain. Pt was last seen by Dr. Georgina Snell 03/16/21 and his L knee was aspirated and steroid injected and he was prescribed colchicine and allopurinol. Pt was taught HEP for hip abductor strengthening. Today, pt reports the L knee has some good days and bad. Pt c/o increase pain in his R hip. Pt has new R elbow pain w/ stiffness esp in the morning. Pt locates pain to the posterior aspect of the R elbow. Pt locates hip pain to the anterior aspect and occasionally along the R lateral and buttock w/ increased pain on stairs. Pt c/o R hip feels like it going to give out.  Dx testing: 03/16/21 Labs & culture 03/16/21 R hip XR 01/20/21 L knee XR  Pertinent review of systems: No fevers or chills  Relevant historical information: Gout   Exam:  BP (!) 150/90    Pulse 83    Ht 5\' 9"  (1.753 m)    Wt 180 lb 9.6 oz (81.9 kg)    SpO2 98%    BMI 26.67 kg/m  General: Well Developed, well nourished, and in no acute distress.   MSK:  Right hip normal-appearing Significantly limited range of motion to internal rotation of flexion producing pain. Mild tender palpation greater trochanter.  Right elbow normal-appearing Not particularly tender. Normal range of motion. Intact strength to elbow extension and flexion. Minimal pain to resisted wrist extension or flexion. Pulses cap refill and sensation are intact distally.    Lab and Radiology Results  Diagnostic Limited MSK Ultrasound of: Right elbow Lateral epicondyle.  No tear or severe tendinopathy of the extensor tendon origin. Medial epicondyle no tear or significant tendinopathy of the common flexor tendon origin. Minimal joint effusion present at lateral elbow. Impression: Mild joint effusion otherwise relatively normal ultrasound appearance  right elbow.  X-ray images right elbow obtained today personally and independently interpreted Minimal degenerative changes.  No acute fractures.  Osteophyte olecranon. Await formal radiology review   EXAM: DG HIP (WITH OR WITHOUT PELVIS) 2-3V RIGHT   COMPARISON:  CT abdomen pelvis dated 01/11/2020   FINDINGS: There is no acute fracture or dislocation. Moderate left and severe right hip arthritic changes. There is complete loss of superior right hip joint space with bone on bone contact. The bones are osteopenic. Degenerative changes of the lower lumbar spine. The soft tissues are unremarkable.   IMPRESSION: 1. No acute fracture or dislocation. 2. Severe arthritic changes of the right hip.     Electronically Signed   By: Anner Crete M.D.   On: 03/17/2021 02:00 I, Lynne Leader, personally (independently) visualized and performed the interpretation of the images attached in this note.   Assessment and Plan: 56 y.o. male with  Right hip pain.  Primary source of hip pain is due to the significant hip DJD seen on x-ray.  At this point he is unable to work because of the hip pain.  Not optimistic that any conservative measures strategies will be successful in steroid injections will very likely delay hip replacement.  Proceed with direct referral to orthopedic surgery for surgical consultation.  He is agreeable to with this plan.  Right elbow pain: Etiology is less clear.  He has more diffuse elbow pain without clear tendinopathy.  Discussed  options.  Offered steroid injection.  He declined.  Watchful waiting recheck in a month.  Recommend Voltaren gel.   PDMP not reviewed this encounter. Orders Placed This Encounter  Procedures   [place] LIMITED JOINT SPACE STRUCTURES LOW RIGHT(NO LINKED CHARGES)    Standing Status:   Future    Number of Occurrences:   1    Standing Expiration Date:   10/03/2021    Order Specific Question:   Reason for Exam (SYMPTOM  OR DIAGNOSIS REQUIRED)     Answer:   right hip pain    Order Specific Question:   Preferred imaging location?    Answer:   [place]   DG ELBOW COMPLETE RIGHT (3+VIEW)    Standing Status:   Future    Number of Occurrences:   1    Standing Expiration Date:   04/05/2022    Order Specific Question:   Reason for Exam (SYMPTOM  OR DIAGNOSIS REQUIRED)    Answer:   right elbow pain    Order Specific Question:   Preferred imaging location?    Answer:   [facility]   Ambulatory referral to Orthopedic Surgery    Referral Priority:   Routine    Referral Type:   Surgical    Referral Reason:   Specialty Services Required    Requested Specialty:   Orthopedic Surgery    Number of Visits Requested:   1   No orders of the defined types were placed in this encounter.    Discussed warning signs or symptoms. Please see discharge instructions. Patient expresses understanding.   The above documentation has been reviewed and is accurate and complete Lynne Leader, M.D.

## 2021-04-05 NOTE — Patient Instructions (Addendum)
Thank you for coming in today.   I've referred you to [facility] for surgical consultation of your right hip  Please get an Xray today before you leave   Recheck elbow in 1 month

## 2021-04-06 NOTE — Progress Notes (Signed)
Elbow x-ray shows a little bit of arthritis.  Please try restarting colchicine for gout.  If not better I can do an injection.

## 2021-04-18 ENCOUNTER — Other Ambulatory Visit: Payer: Self-pay

## 2021-04-18 ENCOUNTER — Ambulatory Visit (INDEPENDENT_AMBULATORY_CARE_PROVIDER_SITE_OTHER): Payer: 59 | Admitting: Orthopaedic Surgery

## 2021-04-18 ENCOUNTER — Encounter: Payer: Self-pay | Admitting: Orthopaedic Surgery

## 2021-04-18 VITALS — Ht 69.0 in | Wt 185.0 lb

## 2021-04-18 DIAGNOSIS — M1611 Unilateral primary osteoarthritis, right hip: Secondary | ICD-10-CM | POA: Diagnosis not present
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
## 2021-05-11 ENCOUNTER — Telehealth: Payer: Self-pay

## 2021-05-11 NOTE — Telephone Encounter (Signed)
Patient came into the office stating that he fell in his yard and is having a lot of pain.  He would like some paper work stating his wellbeing so he can give to his attorney he stated that he is having issues with disability.  He would like to be seen or called back as soon as possible  ?

## 2021-05-11 NOTE — Telephone Encounter (Signed)
Note made.  

## 2021-05-11 NOTE — Telephone Encounter (Signed)
Please write a letter stating that he has advanced right hip degenerative joint disease and will need a hip replacement in the near future.  Thank you.

## 2021-05-12 NOTE — Telephone Encounter (Signed)
Called patient no answer LMOM.

## 2021-05-15 ENCOUNTER — Other Ambulatory Visit: Payer: Self-pay

## 2021-05-18 ENCOUNTER — Telehealth: Payer: Self-pay | Admitting: Orthopaedic Surgery

## 2021-05-18 NOTE — Telephone Encounter (Signed)
Pt called requesting a letter be drawn up to take to his disability hearing on Monday. Pt need letter to state he will be having surgery 06/05/21. He is asking to pick letter up today if possible. Please call pt about this matter at [phone]. ?

## 2021-05-19 NOTE — Telephone Encounter (Signed)
Patient aware.

## 2021-05-19 NOTE — Telephone Encounter (Signed)
Ready for pick up

## 2021-05-29 NOTE — Pre-Procedure Instructions (Addendum)
Surgical Instructions ? ? ? Your procedure is scheduled on Monday, April 3rd. ? Report to [facility] Main Entrance "A" at 12:30 P.M., then check in with the Admitting office. ? Call this number if you have problems the morning of surgery: ? [phone] ? ? If you have any questions prior to your surgery date call [phone]: Open Monday-Friday 8am-4pm ? ? ? Remember: ? Do not eat after midnight the night before your surgery ? ?You may drink clear liquids until 11:30 a.m. the morning of your surgery.   ?Clear liquids allowed are: Water, Non-Citrus Juices (without pulp), Carbonated Beverages, Clear Tea, Black Coffee Only (NO MILK, CREAM OR POWDERED CREAMER of any kind), and Gatorade. ? ? ?Enhanced Recovery after Surgery for Orthopedics ?Enhanced Recovery after Surgery is a protocol used to improve the stress on your body and your recovery after surgery. ? ?Patient Instructions ? ?The day of surgery (if you do NOT have diabetes):  ?Drink ONE (1) Pre-Surgery Clear Ensure by 11:30 am the morning of surgery   ?This drink was given to you during your hospital  ?pre-op appointment visit. ?Nothing else to drink after completing the  ?Pre-Surgery Clear Ensure. ? ?       If you have questions, please contact your surgeon?s office. ? ?  ? Take these medicines the morning of surgery with A SIP OF WATER  ?allopurinol (ZYLOPRIM) ?Colchicine ? ?As of today, STOP taking any Aspirin (unless otherwise instructed by your surgeon) Aleve, Naproxen, Ibuprofen, Motrin, Advil, Goody's, BC's, all herbal medications, fish oil, and all vitamins. ?         ?           ?Do NOT Smoke (Tobacco/Vaping) for 24 hours prior to your procedure. ? ?If you use a CPAP at night, you may bring your mask/headgear for your overnight stay. ?  ?Contacts, glasses, piercing's, hearing aid's, dentures or partials may not be worn into surgery, please bring cases for these belongings.  ?  ?For patients admitted to the hospital, discharge time will be determined by  your treatment team. ?  ?Patients discharged the day of surgery will not be allowed to drive home, and someone needs to stay with them for 24 hours. ? ?SURGICAL WAITING ROOM VISITATION ?Patients having surgery or a procedure may have two support people in the waiting room. These visitors may be switched out with other visitors if needed. ?Children under the age of 29 must have an adult accompany them who is not the patient. ?If the patient needs to stay at the hospital during part of their recovery, the visitor guidelines for inpatient rooms apply. ? ?Please refer to the [facility] website for the visitor guidelines for Inpatients (after your surgery is over and you are in a regular room).  ? ? ?Special instructions:   ?[facility]- Preparing For Surgery ? ?Before surgery, you can play an important role. Because skin is not sterile, your skin needs to be as free of germs as possible. You can reduce the number of germs on your skin by washing with CHG (chlorahexidine gluconate) Soap before surgery.  CHG is an antiseptic cleaner which kills germs and bonds with the skin to continue killing germs even after washing.   ? ?Oral Hygiene is also important to reduce your risk of infection.  Remember - BRUSH YOUR TEETH THE MORNING OF SURGERY WITH YOUR REGULAR TOOTHPASTE ? ?Please do not use if you have an allergy to CHG or antibacterial soaps. If your skin becomes  reddened/irritated stop using the CHG.  ?Do not shave (including legs and underarms) for at least 48 hours prior to first CHG shower. It is OK to shave your face. ? ?Please follow these instructions carefully. ?  ?Shower the NIGHT BEFORE SURGERY and the MORNING OF SURGERY ? ?If you chose to wash your hair, wash your hair first as usual with your normal shampoo. ? ?After you shampoo, rinse your hair and body thoroughly to remove the shampoo. ? ?Use CHG Soap as you would any other liquid soap. You can apply CHG directly to the skin and wash gently with a scrungie or  a clean washcloth.  ? ?Apply the CHG Soap to your body ONLY FROM THE NECK DOWN.  Do not use on open wounds or open sores. Avoid contact with your eyes, ears, mouth and genitals (private parts). Wash Face and genitals (private parts)  with your normal soap.  ? ?Wash thoroughly, paying special attention to the area where your surgery will be performed. ? ?Thoroughly rinse your body with warm water from the neck down. ? ?DO NOT shower/wash with your normal soap after using and rinsing off the CHG Soap. ? ?Pat yourself dry with a CLEAN TOWEL. ? ?Wear CLEAN PAJAMAS to bed the night before surgery ? ?Place CLEAN SHEETS on your bed the night before your surgery ? ?DO NOT SLEEP WITH PETS. ? ? ?Day of Surgery: ?Take a shower with CHG soap. ?Do not wear jewelry. ?Do not wear lotions, powders, colognes, or deodorant. ?Men may shave face and neck. ?Do not bring valuables to the hospital.  ?[facility] is not responsible for any belongings or valuables. ?Wear clean/comfortable clothing to the hospital. ?Do not apply any deodorants or lotions. ?Remember to brush your teeth WITH YOUR REGULAR TOOTHPASTE. ?  ?Please read over the following fact sheets that you were given. ? ? ? ?If you received a COVID test during your pre-op visit  it is requested that you wear a mask when out in public, stay away from anyone that may not be feeling well and notify your surgeon if you develop symptoms. If you have been in contact with anyone that has tested positive in the last 10 days please notify you surgeon. ? ?

## 2021-05-30 ENCOUNTER — Encounter: Payer: Self-pay | Admitting: Emergency Medicine

## 2021-05-30 ENCOUNTER — Encounter (HOSPITAL_COMMUNITY)
Admission: RE | Admit: 2021-05-30 | Discharge: 2021-05-30 | Disposition: A | Discharge status: Home / Self Care | Payer: Commercial Managed Care - HMO | Source: Ambulatory Visit | Attending: Orthopaedic Surgery | Admitting: Orthopaedic Surgery

## 2021-05-30 ENCOUNTER — Other Ambulatory Visit: Payer: Self-pay

## 2021-05-30 ENCOUNTER — Encounter (HOSPITAL_COMMUNITY): Payer: Self-pay

## 2021-05-30 ENCOUNTER — Ambulatory Visit (INDEPENDENT_AMBULATORY_CARE_PROVIDER_SITE_OTHER): Payer: 59 | Admitting: Emergency Medicine

## 2021-05-30 VITALS — BP 152/98 | HR 85 | Temp 98.3°F | Resp 17 | Ht 69.0 in | Wt 186.7 lb

## 2021-05-30 VITALS — BP 138/90 | HR 81 | Temp 98.5°F | Ht 69.0 in | Wt 186.5 lb

## 2021-05-30 DIAGNOSIS — Z7689 Persons encountering health services in other specified circumstances: Secondary | ICD-10-CM

## 2021-05-30 DIAGNOSIS — Z8739 Personal history of other diseases of the musculoskeletal system and connective tissue: Secondary | ICD-10-CM | POA: Diagnosis not present

## 2021-05-30 DIAGNOSIS — M7021 Olecranon bursitis, right elbow: Secondary | ICD-10-CM

## 2021-05-30 DIAGNOSIS — Z8546 Personal history of malignant neoplasm of prostate: Secondary | ICD-10-CM

## 2021-05-30 DIAGNOSIS — A4902 Methicillin resistant Staphylococcus aureus infection, unspecified site: Secondary | ICD-10-CM | POA: Diagnosis not present

## 2021-05-30 DIAGNOSIS — Z01812 Encounter for preprocedural laboratory examination: Secondary | ICD-10-CM | POA: Insufficient documentation

## 2021-05-30 DIAGNOSIS — M1611 Unilateral primary osteoarthritis, right hip: Secondary | ICD-10-CM | POA: Insufficient documentation

## 2021-05-30 DIAGNOSIS — Z01818 Encounter for other preprocedural examination: Secondary | ICD-10-CM

## 2021-05-30 LAB — CBC WITH DIFFERENTIAL/PLATELET
Abs Immature Granulocytes: 0.02 10*3/uL (ref 0.00–0.07)
Basophils Absolute: 0 10*3/uL (ref 0.0–0.1)
Basophils Relative: 1 %
Eosinophils Absolute: 0.1 10*3/uL (ref 0.0–0.5)
Eosinophils Relative: 1 %
HCT: 38.1 % — ABNORMAL LOW (ref 39.0–52.0)
Hemoglobin: 12 g/dL — ABNORMAL LOW (ref 13.0–17.0)
Immature Granulocytes: 0 %
Lymphocytes Relative: 37 %
Lymphs Abs: 1.7 10*3/uL (ref 0.7–4.0)
MCH: 29.9 pg (ref 26.0–34.0)
MCHC: 31.5 g/dL (ref 30.0–36.0)
MCV: 95 fL (ref 80.0–100.0)
Monocytes Absolute: 0.4 10*3/uL (ref 0.1–1.0)
Monocytes Relative: 8 %
Neutro Abs: 2.5 10*3/uL (ref 1.7–7.7)
Neutrophils Relative %: 53 %
Platelets: 285 10*3/uL (ref 150–400)
RBC: 4.01 MIL/uL — ABNORMAL LOW (ref 4.22–5.81)
RDW: 14.5 % (ref 11.5–15.5)
WBC: 4.7 10*3/uL (ref 4.0–10.5)
nRBC: 0 % (ref 0.0–0.2)

## 2021-05-30 LAB — COMPREHENSIVE METABOLIC PANEL
ALT: 61 U/L — ABNORMAL HIGH (ref 0–44)
AST: 65 U/L — ABNORMAL HIGH (ref 15–41)
Albumin: 3.6 g/dL (ref 3.5–5.0)
Alkaline Phosphatase: 75 U/L (ref 38–126)
Anion gap: 5 (ref 5–15)
BUN: 7 mg/dL (ref 6–20)
CO2: 28 mmol/L (ref 22–32)
Calcium: 9.6 mg/dL (ref 8.9–10.3)
Chloride: 107 mmol/L (ref 98–111)
Creatinine, Ser: 0.92 mg/dL (ref 0.61–1.24)
GFR, Estimated: >60 mL/min (ref >60)
Glucose, Bld: 106 mg/dL — ABNORMAL HIGH (ref 70–99)
Potassium: 3.8 mmol/L (ref 3.5–5.1)
Sodium: 140 mmol/L (ref 135–145)
Total Bilirubin: 0.2 mg/dL — ABNORMAL LOW (ref 0.3–1.2)
Total Protein: 6.7 g/dL (ref 6.5–8.1)

## 2021-05-30 LAB — SURGICAL PCR SCREEN
MRSA, PCR: POSITIVE — AB
Staphylococcus aureus: POSITIVE — AB

## 2021-05-30 LAB — TYPE AND SCREEN
ABO/RH(D): O POS
Antibody Screen: NEGATIVE

## 2021-05-30 MED ORDER — TIGER BALM ARTHRITIS RUB 11-11 % EX CREA: 1.0000 "application " | TOPICAL_CREAM | Freq: Every day | CUTANEOUS | 1 refills | Status: AC | PRN

## 2021-05-30 NOTE — Progress Notes (Signed)
PCP - Dr. Lorna Chambers established care as of 05/30/21 ?Cardiologist - denies ? ?PPM/ICD - n/a ? ?Chest x-ray - n/a ?EKG - n/a ?Stress Test - denies ?ECHO - denies ?Cardiac Cath - denies ? ?Sleep Study - denies ?CPAP - denies ? ?Blood Thinner Instructions: n/a ?Aspirin Instructions: n/a ? ?ERAS Protcol -Clear liquids until 1130 DOS ?PRE-SURGERY Ensure or G2- Ensure (1) ? ?COVID TEST- N/A ? ?Anesthesia review: No  ? ?Patient denies shortness of breath, fever, cough and chest pain at PAT appointment ? ? ?All instructions explained to the patient, with a verbal understanding of the material. Patient agrees to go over the instructions while at home for a better understanding. Patient also instructed to self quarantine after being tested for COVID-19. The opportunity to ask questions was provided. ? ? ?

## 2021-05-30 NOTE — Progress Notes (Signed)
MRSA positive

## 2021-05-30 NOTE — Patient Instructions (Signed)
Elbow Bursitis ?Bursitis is swelling and pain at the tip of your elbow. This happens when fluid builds up in a sac under your skin (bursa). This may also be called olecranon bursitis. ?Follow these instructions at home: ?Medicines ?Take over-the-counter and prescription medicines only as told by your doctor. ?If you were prescribed an antibiotic, take it exactly as told by your doctor. Do not stop taking it even if you start to feel better. ?Managing pain, stiffness, and swelling ? ?If told, put ice on your elbow: ?Put ice in a plastic bag. ?Place a towel between your skin and the bag. ?Leave the ice on for 20 minutes, 2-3 times a day. ?If your bursitis is caused by an injury, follow instructions from your doctor about: ?Resting your elbow. ?Wearing a bandage. ?Wear elbow pads or elbow wraps as needed. These help cushion your elbow. ?General instructions ?Avoid any activities that cause elbow pain. Ask your doctor what activities are safe for you. ?Keep all follow-up visits as told by your doctor. This is important. ?Contact a doctor if you have: ?A fever. ?Problems that do not get better with treatment. ?Pain or swelling that: ?Gets worse. ?Goes away and then comes back. ?Pus draining from your elbow. ?Get help right away if you have: ?Trouble moving your arm, hand, or fingers. ?Summary ?Bursitis is swelling and pain at the tip of the elbow. ?You may need to take medicine or put ice on your elbow. ?Contact your doctor if your problems do not get better with treatment. ?This information is not intended to replace advice given to you by your health care provider. Make sure you discuss any questions you have with your health care provider. ?Document Revised: 07/29/2019 Document Reviewed: 08/26/2019 ?Elsevier Patient Education ? [place]. ? ?

## 2021-05-30 NOTE — Assessment & Plan Note (Signed)
Stable with small to moderate effusion.  No signs of infection. ?No indication for drainage.  Tylenol and or Advil for pain as needed. ?Follow-up with orthopedist. ?
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
## 2021-05-30 NOTE — Progress Notes (Signed)
Added vanc to intra-op abx

## 2021-05-30 NOTE — Progress Notes (Signed)
LVM with Myer Haff, surgical scheduler and [employer] Dr. Erlinda Hong regarding pt's surgical PCR results.  ? ?Jacqlyn Larsen, RN ? ?

## 2021-06-05 ENCOUNTER — Observation Stay (HOSPITAL_COMMUNITY): Payer: 59

## 2021-06-05 ENCOUNTER — Observation Stay (HOSPITAL_COMMUNITY)
Admission: RE | Admit: 2021-06-05 | Discharge: 2021-06-06 | Disposition: A | Discharge status: Home Health | Payer: 59 | Attending: Orthopaedic Surgery | Admitting: Orthopaedic Surgery

## 2021-06-05 ENCOUNTER — Other Ambulatory Visit: Payer: Self-pay | Admitting: Physician Assistant

## 2021-06-05 ENCOUNTER — Ambulatory Visit (HOSPITAL_BASED_OUTPATIENT_CLINIC_OR_DEPARTMENT_OTHER): Payer: 59 | Admitting: Anesthesiology

## 2021-06-05 ENCOUNTER — Ambulatory Visit (HOSPITAL_COMMUNITY): Payer: 59

## 2021-06-05 ENCOUNTER — Encounter (HOSPITAL_COMMUNITY): Payer: Self-pay | Admitting: Orthopaedic Surgery

## 2021-06-05 ENCOUNTER — Encounter (HOSPITAL_COMMUNITY)
Admission: RE | Disposition: A | Discharge status: Home Health | Payer: Self-pay | Source: Home / Self Care | Attending: Orthopaedic Surgery

## 2021-06-05 ENCOUNTER — Ambulatory Visit (HOSPITAL_COMMUNITY): Payer: 59 | Admitting: Physician Assistant

## 2021-06-05 ENCOUNTER — Other Ambulatory Visit: Payer: Self-pay

## 2021-06-05 DIAGNOSIS — I10 Essential (primary) hypertension: Secondary | ICD-10-CM

## 2021-06-05 DIAGNOSIS — Z96641 Presence of right artificial hip joint: Secondary | ICD-10-CM

## 2021-06-05 DIAGNOSIS — M1611 Unilateral primary osteoarthritis, right hip: Secondary | ICD-10-CM | POA: Diagnosis not present

## 2021-06-05 DIAGNOSIS — Z8546 Personal history of malignant neoplasm of prostate: Secondary | ICD-10-CM | POA: Diagnosis not present

## 2021-06-05 HISTORY — PX: TOTAL HIP ARTHROPLASTY: SHX124

## 2021-06-05 LAB — ABO/RH: ABO/RH(D): O POS

## 2021-06-05 SURGERY — ARTHROPLASTY, HIP, TOTAL, ANTERIOR APPROACH
Anesthesia: Monitor Anesthesia Care | Site: Hip | Laterality: Right

## 2021-06-05 MED ORDER — LACTATED RINGERS IV SOLN: INTRAVENOUS | Status: DC

## 2021-06-05 MED ORDER — ORAL CARE MOUTH RINSE: 15.0000 mL | Freq: Once | OROMUCOSAL | Status: AC

## 2021-06-05 MED ORDER — OXYCODONE HCL 5 MG PO TABS
10.0000 mg | ORAL_TABLET | ORAL | Status: DC | PRN
  Administered 2021-06-06 (×2): 10 mg via ORAL
  Filled 2021-06-05: qty 2

## 2021-06-05 MED ORDER — FENTANYL CITRATE (PF) 250 MCG/5ML IJ SOLN
INTRAMUSCULAR | Status: DC | PRN
  Administered 2021-06-05: 50 ug via INTRAVENOUS

## 2021-06-05 MED ORDER — ONDANSETRON HCL 4 MG PO TABS: 4.0000 mg | ORAL_TABLET | Freq: Three times a day (TID) | ORAL | 0 refills | Status: DC | PRN

## 2021-06-05 MED ORDER — DIPHENHYDRAMINE HCL 12.5 MG/5ML PO ELIX
25.0000 mg | ORAL_SOLUTION | ORAL | Status: DC | PRN
  Filled 2021-06-05: qty 10

## 2021-06-05 MED ORDER — METOCLOPRAMIDE HCL 5 MG/ML IJ SOLN
5.0000 mg | Freq: Three times a day (TID) | INTRAMUSCULAR | Status: DC | PRN
  Administered 2021-06-05: 10 mg via INTRAVENOUS
  Filled 2021-06-05: qty 2

## 2021-06-05 MED ORDER — ALUM & MAG HYDROXIDE-SIMETH 200-200-20 MG/5ML PO SUSP: 30.0000 mL | ORAL | Status: DC | PRN

## 2021-06-05 MED ORDER — ASPIRIN 81 MG PO CHEW
81.0000 mg | CHEWABLE_TABLET | Freq: Two times a day (BID) | ORAL | Status: DC
  Administered 2021-06-05 – 2021-06-06 (×2): 81 mg via ORAL
  Filled 2021-06-05 (×2): qty 1

## 2021-06-05 MED ORDER — SODIUM CHLORIDE 0.9 % IV SOLN
INTRAVENOUS | Status: DC | PRN
  Administered 2021-06-05: 2000 mg via TOPICAL

## 2021-06-05 MED ORDER — BUPIVACAINE-MELOXICAM ER 400-12 MG/14ML IJ SOLN
INTRAMUSCULAR | Status: AC
  Filled 2021-06-05: qty 1

## 2021-06-05 MED ORDER — METHOCARBAMOL 500 MG PO TABS: 500.0000 mg | ORAL_TABLET | Freq: Two times a day (BID) | ORAL | 0 refills | Status: DC | PRN

## 2021-06-05 MED ORDER — PHENYLEPHRINE HCL-NACL 20-0.9 MG/250ML-% IV SOLN
INTRAVENOUS | Status: DC | PRN
  Administered 2021-06-05: 20 ug/min via INTRAVENOUS

## 2021-06-05 MED ORDER — KETOROLAC TROMETHAMINE 30 MG/ML IJ SOLN: 30.0000 mg | Freq: Once | INTRAMUSCULAR | Status: DC | PRN

## 2021-06-05 MED ORDER — BUPIVACAINE-MELOXICAM ER 400-12 MG/14ML IJ SOLN
INTRAMUSCULAR | Status: DC | PRN
Start: 2021-06-05 — End: 2021-06-05
  Administered 2021-06-05: 400 mg

## 2021-06-05 MED ORDER — ACETAMINOPHEN 500 MG PO TABS
1000.0000 mg | ORAL_TABLET | Freq: Once | ORAL | Status: AC
  Administered 2021-06-05: 1000 mg via ORAL
  Filled 2021-06-05: qty 2

## 2021-06-05 MED ORDER — PRONTOSAN WOUND IRRIGATION OPTIME
TOPICAL | Status: DC | PRN
  Administered 2021-06-05: 1

## 2021-06-05 MED ORDER — ONDANSETRON HCL 4 MG/2ML IJ SOLN: 4.0000 mg | Freq: Once | INTRAMUSCULAR | Status: DC | PRN

## 2021-06-05 MED ORDER — TRANEXAMIC ACID 1000 MG/10ML IV SOLN
2000.0000 mg | INTRAVENOUS | Status: DC
  Filled 2021-06-05: qty 20

## 2021-06-05 MED ORDER — ASPIRIN EC 81 MG PO TBEC: 81.0000 mg | DELAYED_RELEASE_TABLET | Freq: Two times a day (BID) | ORAL | 0 refills | Status: DC

## 2021-06-05 MED ORDER — PHENOL 1.4 % MT LIQD: 1.0000 | OROMUCOSAL | Status: DC | PRN

## 2021-06-05 MED ORDER — OXYCODONE HCL 5 MG/5ML PO SOLN: 5.0000 mg | Freq: Once | ORAL | Status: DC | PRN

## 2021-06-05 MED ORDER — ONDANSETRON HCL 4 MG PO TABS: 4.0000 mg | ORAL_TABLET | Freq: Four times a day (QID) | ORAL | Status: DC | PRN

## 2021-06-05 MED ORDER — ACETAMINOPHEN 325 MG PO TABS: 325.0000 mg | ORAL_TABLET | Freq: Four times a day (QID) | ORAL | Status: DC | PRN

## 2021-06-05 MED ORDER — MIDAZOLAM HCL 2 MG/2ML IJ SOLN
INTRAMUSCULAR | Status: AC
  Filled 2021-06-05: qty 2

## 2021-06-05 MED ORDER — TRANEXAMIC ACID-NACL 1000-0.7 MG/100ML-% IV SOLN
1000.0000 mg | Freq: Once | INTRAVENOUS | Status: AC
  Administered 2021-06-05: 1000 mg via INTRAVENOUS
  Filled 2021-06-05 (×2): qty 100

## 2021-06-05 MED ORDER — MEPERIDINE HCL 25 MG/ML IJ SOLN
6.2500 mg | INTRAMUSCULAR | Status: DC | PRN
  Administered 2021-06-05: 6.25 mg via INTRAVENOUS

## 2021-06-05 MED ORDER — MENTHOL 3 MG MT LOZG: 1.0000 | LOZENGE | OROMUCOSAL | Status: DC | PRN

## 2021-06-05 MED ORDER — ACETAMINOPHEN 500 MG PO TABS
1000.0000 mg | ORAL_TABLET | Freq: Four times a day (QID) | ORAL | Status: DC
Start: 2021-06-05 — End: 2021-06-06
  Administered 2021-06-05 – 2021-06-06 (×3): 1000 mg via ORAL
  Filled 2021-06-05 (×3): qty 2

## 2021-06-05 MED ORDER — POVIDONE-IODINE 10 % EX SWAB: 2.0000 "application " | Freq: Once | CUTANEOUS | Status: DC

## 2021-06-05 MED ORDER — SODIUM CHLORIDE 0.9 % IR SOLN
Status: DC | PRN
  Administered 2021-06-05: 1000 mL

## 2021-06-05 MED ORDER — DEXAMETHASONE SODIUM PHOSPHATE 10 MG/ML IJ SOLN
10.0000 mg | Freq: Once | INTRAMUSCULAR | Status: AC
  Administered 2021-06-06: 10 mg via INTRAVENOUS
  Filled 2021-06-05: qty 1

## 2021-06-05 MED ORDER — PANTOPRAZOLE SODIUM 40 MG PO TBEC
40.0000 mg | DELAYED_RELEASE_TABLET | Freq: Every day | ORAL | Status: DC
  Administered 2021-06-05 – 2021-06-06 (×2): 40 mg via ORAL
  Filled 2021-06-05 (×2): qty 1

## 2021-06-05 MED ORDER — VANCOMYCIN HCL IN DEXTROSE 1-5 GM/200ML-% IV SOLN
1000.0000 mg | Freq: Once | INTRAVENOUS | Status: AC
  Administered 2021-06-05: 1000 mg via INTRAVENOUS
  Filled 2021-06-05: qty 200

## 2021-06-05 MED ORDER — TRANEXAMIC ACID-NACL 1000-0.7 MG/100ML-% IV SOLN
1000.0000 mg | INTRAVENOUS | Status: AC
  Administered 2021-06-05: 1000 mg via INTRAVENOUS
  Filled 2021-06-05: qty 100

## 2021-06-05 MED ORDER — OXYCODONE-ACETAMINOPHEN 5-325 MG PO TABS: 1.0000 | ORAL_TABLET | Freq: Four times a day (QID) | ORAL | 0 refills | Status: DC | PRN

## 2021-06-05 MED ORDER — 0.9 % SODIUM CHLORIDE (POUR BTL) OPTIME
TOPICAL | Status: DC | PRN
  Administered 2021-06-05: 1000 mL

## 2021-06-05 MED ORDER — MIDAZOLAM HCL 2 MG/2ML IJ SOLN
INTRAMUSCULAR | Status: DC | PRN
Start: 2021-06-05 — End: 2021-06-05
  Administered 2021-06-05: 2 mg via INTRAVENOUS

## 2021-06-05 MED ORDER — HYDROMORPHONE HCL 1 MG/ML IJ SOLN
0.5000 mg | INTRAMUSCULAR | Status: DC | PRN
  Administered 2021-06-05 (×2): 1 mg via INTRAVENOUS
  Filled 2021-06-05 (×2): qty 1

## 2021-06-05 MED ORDER — METOCLOPRAMIDE HCL 5 MG PO TABS: 5.0000 mg | ORAL_TABLET | Freq: Three times a day (TID) | ORAL | Status: DC | PRN

## 2021-06-05 MED ORDER — CEFAZOLIN SODIUM-DEXTROSE 2-4 GM/100ML-% IV SOLN
2.0000 g | Freq: Four times a day (QID) | INTRAVENOUS | Status: AC
  Administered 2021-06-05 – 2021-06-06 (×2): 2 g via INTRAVENOUS
  Filled 2021-06-05 (×2): qty 100

## 2021-06-05 MED ORDER — OXYCODONE HCL ER 10 MG PO T12A
10.0000 mg | EXTENDED_RELEASE_TABLET | Freq: Two times a day (BID) | ORAL | Status: DC
  Administered 2021-06-05 – 2021-06-06 (×2): 10 mg via ORAL
  Filled 2021-06-05 (×2): qty 1

## 2021-06-05 MED ORDER — VANCOMYCIN HCL 1 G IV SOLR
INTRAVENOUS | Status: DC | PRN
  Administered 2021-06-05: 1000 mg via TOPICAL

## 2021-06-05 MED ORDER — VANCOMYCIN HCL 1000 MG IV SOLR
INTRAVENOUS | Status: AC
  Filled 2021-06-05: qty 20

## 2021-06-05 MED ORDER — HYDROMORPHONE HCL 1 MG/ML IJ SOLN: 0.2500 mg | INTRAMUSCULAR | Status: DC | PRN

## 2021-06-05 MED ORDER — MEPERIDINE HCL 25 MG/ML IJ SOLN
INTRAMUSCULAR | Status: AC
Start: 2021-06-05 — End: 2021-06-06
  Filled 2021-06-05: qty 1

## 2021-06-05 MED ORDER — AMISULPRIDE (ANTIEMETIC) 5 MG/2ML IV SOLN: 10.0000 mg | Freq: Once | INTRAVENOUS | Status: DC | PRN

## 2021-06-05 MED ORDER — SORBITOL 70 % SOLN: 30.0000 mL | Freq: Every day | Status: DC | PRN

## 2021-06-05 MED ORDER — DOCUSATE SODIUM 100 MG PO CAPS
100.0000 mg | ORAL_CAPSULE | Freq: Two times a day (BID) | ORAL | Status: DC
  Administered 2021-06-05 – 2021-06-06 (×2): 100 mg via ORAL
  Filled 2021-06-05 (×2): qty 1

## 2021-06-05 MED ORDER — OXYCODONE HCL 5 MG PO TABS
5.0000 mg | ORAL_TABLET | ORAL | Status: DC | PRN
  Administered 2021-06-05 – 2021-06-06 (×2): 10 mg via ORAL
  Filled 2021-06-05 (×3): qty 2

## 2021-06-05 MED ORDER — CHLORHEXIDINE GLUCONATE 0.12 % MT SOLN
15.0000 mL | Freq: Once | OROMUCOSAL | Status: AC
  Administered 2021-06-05: 15 mL via OROMUCOSAL
  Filled 2021-06-05: qty 15

## 2021-06-05 MED ORDER — DOCUSATE SODIUM 100 MG PO CAPS: 100.0000 mg | ORAL_CAPSULE | Freq: Every day | ORAL | 2 refills | Status: DC | PRN

## 2021-06-05 MED ORDER — FENTANYL CITRATE (PF) 250 MCG/5ML IJ SOLN
INTRAMUSCULAR | Status: AC
  Filled 2021-06-05: qty 5

## 2021-06-05 MED ORDER — METHOCARBAMOL 500 MG PO TABS
500.0000 mg | ORAL_TABLET | Freq: Four times a day (QID) | ORAL | Status: DC | PRN
  Administered 2021-06-05 – 2021-06-06 (×3): 500 mg via ORAL
  Filled 2021-06-05 (×3): qty 1

## 2021-06-05 MED ORDER — OXYCODONE HCL 5 MG PO TABS: 5.0000 mg | ORAL_TABLET | Freq: Once | ORAL | Status: DC | PRN

## 2021-06-05 MED ORDER — POLYETHYLENE GLYCOL 3350 17 G PO PACK
17.0000 g | PACK | Freq: Every day | ORAL | Status: DC
  Administered 2021-06-06: 17 g via ORAL
  Filled 2021-06-05: qty 1

## 2021-06-05 MED ORDER — SODIUM CHLORIDE 0.9 % IV SOLN: INTRAVENOUS | Status: DC

## 2021-06-05 MED ORDER — ONDANSETRON HCL 4 MG/2ML IJ SOLN: 4.0000 mg | Freq: Four times a day (QID) | INTRAMUSCULAR | Status: DC | PRN

## 2021-06-05 MED ORDER — CEFAZOLIN SODIUM-DEXTROSE 2-4 GM/100ML-% IV SOLN
2.0000 g | INTRAVENOUS | Status: AC
  Administered 2021-06-05: 2 g via INTRAVENOUS
  Filled 2021-06-05: qty 100

## 2021-06-05 MED ORDER — PROPOFOL 500 MG/50ML IV EMUL
INTRAVENOUS | Status: DC | PRN
  Administered 2021-06-05: 100 ug/kg/min via INTRAVENOUS

## 2021-06-05 MED ORDER — SULFAMETHOXAZOLE-TRIMETHOPRIM 800-160 MG PO TABS: 1.0000 | ORAL_TABLET | Freq: Two times a day (BID) | ORAL | 0 refills | Status: DC

## 2021-06-05 MED ORDER — METHOCARBAMOL 1000 MG/10ML IJ SOLN
500.0000 mg | Freq: Four times a day (QID) | INTRAVENOUS | Status: DC | PRN
  Filled 2021-06-05: qty 5

## 2021-06-05 SURGICAL SUPPLY — 66 items
ADH SKN CLS APL DERMABOND .7 (GAUZE/BANDAGES/DRESSINGS) ×1
BAG COUNTER SPONGE SURGICOUNT (BAG) ×2 IMPLANT
BAG DECANTER FOR FLEXI CONT (MISCELLANEOUS) ×2 IMPLANT
BAG SPNG CNTER NS LX DISP (BAG) ×1
BLADE SAG 18X100X1.27 (BLADE) ×1 IMPLANT
CELLS DAT CNTRL 66122 CELL SVR (MISCELLANEOUS) IMPLANT
COVER PERINEAL POST (MISCELLANEOUS) ×2 IMPLANT
COVER SURGICAL LIGHT HANDLE (MISCELLANEOUS) ×2 IMPLANT
DERMABOND ADVANCED (GAUZE/BANDAGES/DRESSINGS) ×1
DERMABOND ADVANCED .7 DNX12 (GAUZE/BANDAGES/DRESSINGS) IMPLANT
DRAPE C-ARM 42X72 X-RAY (DRAPES) ×2 IMPLANT
DRAPE POUCH INSTRU U-SHP 10X18 (DRAPES) ×2 IMPLANT
DRAPE STERI IOBAN 125X83 (DRAPES) ×2 IMPLANT
DRAPE U-SHAPE 47X51 STRL (DRAPES) ×4 IMPLANT
DRSG AQUACEL AG ADV 3.5X10 (GAUZE/BANDAGES/DRESSINGS) ×2 IMPLANT
DURAPREP 26ML APPLICATOR (WOUND CARE) ×4 IMPLANT
ELECT BLADE 4.0 EZ CLEAN MEGAD (MISCELLANEOUS) ×2
ELECT REM PT RETURN 9FT ADLT (ELECTROSURGICAL) ×2
ELECTRODE BLDE 4.0 EZ CLN MEGD (MISCELLANEOUS) ×1 IMPLANT
ELECTRODE REM PT RTRN 9FT ADLT (ELECTROSURGICAL) ×1 IMPLANT
FEM STEM 12/14 TAPER SZ 4 HIP (Orthopedic Implant) ×2 IMPLANT
FEMORAL STEM 12/14 TPR SZ4 HIP (Orthopedic Implant) IMPLANT
GLOVE BIOGEL PI IND STRL 7.5 (GLOVE) ×4 IMPLANT
GLOVE BIOGEL PI INDICATOR 7.5 (GLOVE) ×4
GLOVE SURG LTX SZ7 (GLOVE) ×4 IMPLANT
GLOVE SURG UNDER POLY LF SZ7 (GLOVE) ×4 IMPLANT
GLOVE SURG UNDER POLY LF SZ7.5 (GLOVE) ×4 IMPLANT
GOWN STRL REIN XL XLG (GOWN DISPOSABLE) ×2 IMPLANT
GOWN STRL REUS W/ TWL LRG LVL3 (GOWN DISPOSABLE) IMPLANT
GOWN STRL REUS W/ TWL XL LVL3 (GOWN DISPOSABLE) ×1 IMPLANT
GOWN STRL REUS W/TWL LRG LVL3 (GOWN DISPOSABLE)
GOWN STRL REUS W/TWL XL LVL3 (GOWN DISPOSABLE) ×2
HANDPIECE INTERPULSE COAX TIP (DISPOSABLE) ×2
HEAD CERAMIC 36 PLUS 8.5 12 14 (Hips) ×1 IMPLANT
HOOD PEEL AWAY FLYTE STAYCOOL (MISCELLANEOUS) ×4 IMPLANT
IV NS IRRIG 3000ML ARTHROMATIC (IV SOLUTION) ×2 IMPLANT
JET LAVAGE IRRISEPT WOUND (IRRIGATION / IRRIGATOR)
KIT BASIN OR (CUSTOM PROCEDURE TRAY) ×2 IMPLANT
LAVAGE JET IRRISEPT WOUND (IRRIGATION / IRRIGATOR) ×1 IMPLANT
LINER NEUTRAL 52X36MM PLUS 4 (Liner) ×1 IMPLANT
MARKER SKIN DUAL TIP RULER LAB (MISCELLANEOUS) ×2 IMPLANT
NDL SPNL 18GX3.5 QUINCKE PK (NEEDLE) ×1 IMPLANT
NEEDLE SPNL 18GX3.5 QUINCKE PK (NEEDLE) ×2 IMPLANT
PACK TOTAL JOINT (CUSTOM PROCEDURE TRAY) ×2 IMPLANT
PACK UNIVERSAL I (CUSTOM PROCEDURE TRAY) ×2 IMPLANT
PIN SECTOR W/GRIP ACE CUP 52MM (Hips) ×1 IMPLANT
RETRACTOR WND ALEXIS 18 MED (MISCELLANEOUS) IMPLANT
RTRCTR WOUND ALEXIS 18CM MED (MISCELLANEOUS)
SAW OSC TIP CART 19.5X105X1.3 (SAW) ×1 IMPLANT
SET HNDPC FAN SPRY TIP SCT (DISPOSABLE) ×1 IMPLANT
SOLUTION PRONTOSAN WOUND 350ML (IRRIGATION / IRRIGATOR) ×1 IMPLANT
SPONGE T-LAP 18X18 ~~LOC~~+RFID (SPONGE) ×4 IMPLANT
STAPLER VISISTAT 35W (STAPLE) IMPLANT
SUT ETHIBOND 2 V 37 (SUTURE) ×2 IMPLANT
SUT VIC AB 0 CT1 27 (SUTURE) ×2
SUT VIC AB 0 CT1 27XBRD ANBCTR (SUTURE) ×1 IMPLANT
SUT VIC AB 1 CTX 36 (SUTURE) ×2
SUT VIC AB 1 CTX36XBRD ANBCTR (SUTURE) ×1 IMPLANT
SUT VIC AB 2-0 CT1 27 (SUTURE) ×4
SUT VIC AB 2-0 CT1 TAPERPNT 27 (SUTURE) ×2 IMPLANT
SYR 50ML LL SCALE MARK (SYRINGE) ×2 IMPLANT
TOWEL GREEN STERILE (TOWEL DISPOSABLE) ×2 IMPLANT
TRAY CATH 16FR W/PLASTIC CATH (SET/KITS/TRAYS/PACK) IMPLANT
TRAY FOLEY W/BAG SLVR 16FR (SET/KITS/TRAYS/PACK) ×2
TRAY FOLEY W/BAG SLVR 16FR ST (SET/KITS/TRAYS/PACK) ×1 IMPLANT
YANKAUER SUCT BULB TIP NO VENT (SUCTIONS) ×2 IMPLANT

## 2021-06-05 NOTE — Discharge Instructions (Signed)

## 2021-06-05 NOTE — Anesthesia Postprocedure Evaluation (Signed)
Anesthesia Post Note ? ?Patient: Bryan Chambers ? ?Procedure(s) Performed: RIGHT TOTAL HIP ARTHROPLASTY ANTERIOR APPROACH (Right: Hip) ? ?  ? ?Patient location during evaluation: PACU ?Anesthesia Type: Spinal ?Level of consciousness: awake ?Pain management: pain level controlled ?Vital Signs Assessment: post-procedure vital signs reviewed and stable ?Respiratory status: spontaneous breathing, nonlabored ventilation, respiratory function stable and patient connected to nasal cannula oxygen ?Cardiovascular status: stable and blood pressure returned to baseline ?Postop Assessment: no apparent nausea or vomiting ?Anesthetic complications: no ? ? ?No notable events documented. ? ?Last Vitals:  ?Vitals:  ? 06/05/21 1700 06/05/21 1730  ?BP: (!) 155/105 (!) 152/101  ?Pulse: 72 70  ?Resp: 18 15  ?Temp: 36.5 ?C   ?SpO2: 100% 100%  ?  ?Last Pain:  ?Vitals:  ? 06/05/21 1700  ?TempSrc:   ?PainSc: 0-No pain  ? ? ?  ?  ?  ?  ?  ?  ? ?Bryan Chambers P Kavitha [place]dale ? ? ? ? ?

## 2021-06-05 NOTE — Anesthesia Procedure Notes (Signed)
Procedure Name: [place] ?Date/Time: 06/05/2021 2:15 PM ?Performed by: Kyung Rudd, CRNA ?Pre-anesthesia Checklist: Patient identified, Emergency Drugs available, Suction available and Patient being monitored ?Patient Re-evaluated:Patient Re-evaluated prior to induction ?Oxygen Delivery Method: Simple face mask ?Induction Type: IV induction ?Placement Confirmation: positive ETCO2 ?Dental Injury: Teeth and Oropharynx as per pre-operative assessment  ? ? ? ? ?

## 2021-06-05 NOTE — TOC Progression Note (Signed)
Transition of Care (TOC) - Progression Note  ? ? ?Patient Details  ?Name: Bryan Chambers ?MRN: [id number] ?Date of Birth: 1965-12-07 ? ?Transition of Care (TOC) CM/SW Contact  ?Marilu Favre, RN ?Phone Number: ?06/05/2021, 4:24 PM ? ?Clinical Narrative:    ? ?Dr Phoebe Sharps office has arranged home health services with [facility].  ?  ?[facility] staff will provide any needed DME  ? ?  ?Transition of Care (TOC) Screening Note ? ? ?Patient Details  ?Name: Bryan Chambers ?Date of Birth: 01-May-1965 ? ? ? ?Transition of Care Department [facility]) has reviewed patient and no TOC needs have been identified at this time. We will continue to monitor patient advancement through interdisciplinary progression rounds. If new patient transition needs arise, please place a TOC consult. ?  ?  ? ?Expected Discharge Plan and Services ?  ?  ?  ?  ?  ?                ?  ?  ?  ?  ?  ?  ?  ?  ?  ?  ? ? ?Social Determinants of Health (SDOH) Interventions ?  ? ?Readmission Risk Interventions ?   ? View : No data to display.  ?  ?  ?  ? ? ?
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
## 2021-06-05 NOTE — Anesthesia Preprocedure Evaluation (Addendum)
Anesthesia Evaluation  ?Patient identified by MRN, date of birth, ID band ?Patient awake ? ? ? ?Reviewed: ?Allergy & Precautions, NPO status , Patient's Chart, lab work & pertinent test results ? ?Airway ?Mallampati: III ? ?TM Distance: >3 FB ?Neck ROM: Full ? ? ? Dental ? ?(+) Teeth Intact, Dental Advisory Given ?  ?Pulmonary ? ?States snores sometimes ?  ?Pulmonary exam normal ?breath sounds clear to auscultation ? ? ? ? ? ? 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 ? ?  ?Neuro/Psych ?negative neurological ROS ? negative psych ROS  ? GI/Hepatic ?negative GI ROS, Neg liver ROS,   ?Endo/Other  ?negative endocrine ROS ? Renal/GU ?negative Renal ROS  ?negative genitourinary ?  ?Musculoskeletal ? ?(+) Arthritis , Osteoarthritis,   ? Abdominal ?  ?Peds ? Hematology ?hct 38.1, plt 285   ?Anesthesia Other Findings ? ? Reproductive/Obstetrics ?negative OB ROS ? ?  ? ? ? ? ? ? ? ? ? ? ? ? ? ?  ?  ? ? ? ? ? ? ? ?Anesthesia Physical ?Anesthesia Plan ? ?ASA: 2 ? ?Anesthesia Plan: Spinal and MAC  ? ?Post-op Pain Management: Tylenol PO (pre-op)*  ? ?Induction:  ? ?PONV Risk Score and Plan: 2 and Propofol infusion and TIVA ? ?Airway Management Planned: Natural Airway and Nasal Cannula ? ?Additional Equipment: None ? ?Intra-op Plan:  ? ?Post-operative Plan:  ? ?Informed Consent: I have reviewed the patients History and Physical, chart, labs and discussed the procedure including the risks, benefits and alternatives for the proposed anesthesia with the patient or authorized representative who has indicated his/her understanding and acceptance.  ? ? ? ?Dental advisory given ? ?Plan Discussed with: CRNA ? ?Anesthesia Plan Comments:   ? ? ? ? ? ?Anesthesia Quick Evaluation ? ?

## 2021-06-05 NOTE — Anesthesia Procedure Notes (Signed)
Spinal ? ?Patient location during procedure: OR ?Start time: 06/05/2021 2:07 PM ?End time: 06/05/2021 2:11 PM ?Reason for block: surgical anesthesia ?Staffing ?Performed: anesthesiologist  ?Anesthesiologist: Pervis Hocking, DO ?Preanesthetic Checklist ?Completed: patient identified, IV checked, risks and benefits discussed, surgical consent, monitors and equipment checked, pre-op evaluation and timeout performed ?Spinal Block ?Patient position: sitting ?Prep: DuraPrep and site prepped and draped ?Patient monitoring: cardiac monitor, continuous pulse ox and blood pressure ?Approach: midline ?Location: L3-4 ?Injection technique: single-shot ?Needle ?Needle type: Pencan  ?Needle gauge: 24 G ?Needle length: 9 cm ?Assessment ?Sensory level: T6 ?Events: CSF return ?Additional Notes ?Functioning IV was confirmed and monitors were applied. Sterile prep and drape, including hand hygiene and sterile gloves were used. The patient was positioned and the spine was prepped. The skin was anesthetized with lidocaine.  Free flow of clear CSF was obtained prior to injecting local anesthetic into the CSF.  The spinal needle aspirated freely following injection.  The needle was carefully withdrawn.  The patient tolerated the procedure well.  ? ? ? ?

## 2021-06-05 NOTE — Transfer of Care (Signed)
Immediate Anesthesia Transfer of Care Note ? ?Patient: Bryan Chambers ? ?Procedure(s) Performed: RIGHT TOTAL HIP ARTHROPLASTY ANTERIOR APPROACH (Right: Hip) ? ?Patient Location: PACU ? ?Anesthesia Type:MAC and Spinal ? ?Level of Consciousness: awake, alert  and oriented ? ?Airway & Oxygen Therapy: Patient Spontanous Breathing and Patient connected to face mask oxygen ? ?Post-op Assessment: Report given to RN and Post -op Vital signs reviewed and stable ? ?Post vital signs: Reviewed and stable ? ?Last Vitals:  ?Vitals Value Taken Time  ?BP 127/86 06/05/21 1601  ?Temp 36.5 ?C 06/05/21 1600  ?Pulse 70 06/05/21 1604  ?Resp 15 06/05/21 1604  ?SpO2 100 % 06/05/21 1604  ?Vitals shown include unvalidated device data. ? ?Last Pain:  ?Vitals:  ? 06/05/21 1600  ?TempSrc:   ?PainSc: 0-No pain  ?   ? ?Patients Stated Pain Goal: 4 (06/05/21 1240) ? ?Complications: No notable events documented. ?

## 2021-06-05 NOTE — Op Note (Signed)
? ?RIGHT TOTAL HIP ARTHROPLASTY ANTERIOR APPROACH  Procedure Note ?Mattis Featherly   [id number] ? ?Pre-op Diagnosis: right hip degenerative joint disease ?    ?Post-op Diagnosis: same ?  ?Operative Procedures  ?1. Total hip replacement; Right hip; uncemented [id number]  ? ?Surgeon: Frankey Shown, M.D. ? ?Assist: Madalyn Rob, PA-C ?  ?Anesthesia: spinal ? ?Prosthesis: Depuy ?Acetabulum: Pinnacle 52 mm ?Femur: Actis 4 HO ?Head: 36 mm size: +8.5 ?Liner: +4 ?Bearing Type: ceramic/poly ? ?Total Hip Arthroplasty (Anterior Approach) Op Note:  ?After informed consent was obtained and the operative extremity marked in the holding area, the patient was brought back to the operating room and placed supine on the HANA table. Next, the operative extremity was prepped and draped in normal sterile fashion. Surgical timeout occurred verifying patient identification, surgical site, surgical procedure and administration of antibiotics.  ?A modified anterior Smith-Peterson approach to the hip was performed, using the interval between tensor fascia lata and sartorius.  Dissection was carried bluntly down onto the anterior hip capsule. The lateral femoral circumflex vessels were identified and coagulated. A capsulotomy was performed and the capsular flaps tagged for later repair.  The neck osteotomy was performed. The femoral head was removed which showed severe wear, the acetabular rim was cleared of soft tissue and attention was turned to reaming the acetabulum.  ?Sequential reaming was performed under fluoroscopic guidance. We reamed to a size 52 mm, and then impacted the acetabular shell.   The liner was then placed after irrigation and attention turned to the femur.  ?After placing the femoral hook, the leg was taken to externally rotated, extended and adducted position taking care to perform soft tissue releases to allow for adequate mobilization of the femur. Soft tissue was cleared from the shoulder of the greater trochanter  and the hook elevator used to improve exposure of the proximal femur. Sequential broaching performed up to a size 4. Trial neck and head were placed. The leg was brought back up to neutral and the construct reduced.  Antibiotic irrigation was placed in the surgical wound and kept for at least 1 minute.  The position and sizing of components, offset and leg lengths were checked using fluoroscopy. Stability of the construct was checked in extension and external rotation without any subluxation or impingement of prosthesis. We dislocated the prosthesis, dropped the leg back into position, removed trial components, and irrigated copiously. The final stem and head was then placed, the leg brought back up, the system reduced and fluoroscopy used to verify positioning.  ?We irrigated, obtained hemostasis and closed the capsule using #2 ethibond suture.  One gram of vancomycin powder was placed in the surgical bed.   One gram of topical tranexamic acid was injected into the joint.  The fascia was closed with #1 vicryl plus, the deep fat layer was closed with 0 vicryl, the subcutaneous layers closed with 2.0 Vicryl Plus and the skin closed with 2.0 nylon and dermabond. A sterile dressing was applied. The patient was awakened in the operating room and taken to recovery in stable condition.  ?All sponge, needle, and instrument counts were correct at the end of the case.  ? ?[name], my PA, was a medical necessity for opening, closing, limb positioning, retracting, exposing, and overall facilitation and timely completion of the surgery. ? ?Position: supine  ?Complications: see description of procedure.  ?Time Out: performed  ? ?Drains/Packing: none ? ?Estimated blood loss: see anesthesia record ? ?Returned to Recovery Room: in good condition.  ? ?  Antibiotics: yes  ? ?Mechanical VTE (DVT) Prophylaxis: sequential compression devices, TED thigh-high  ?Chemical VTE (DVT) Prophylaxis: aspirin  ? ?Fluid Replacement: see  anesthesia record ? ?Specimens Removed: 1 to pathology  ? ?Sponge and Instrument Count Correct? yes  ? ?PACU: portable radiograph - low AP  ? ?Plan/RTC: Return in 2 weeks for staple removal. ?Weight Bearing/Load Lower Extremity: full  ?Hip precautions: none ?Suture Removal: 2 weeks  ? ?N. Eduard Roux, MD ?Marga Hoots ?3:35 PM ? ? ?Implant Name Type Inv. Item Serial No. Manufacturer Lot No. LRB No. Used Action  ?PIN SECTOR W/GRIP ACE CUP 52MM - [id number] Hips PIN SECTOR W/GRIP ACE CUP 52MM  [employer] [id number] Right 1 Implanted  ?LINER NEUTRAL 52X36MM PLUS 4 - [id number] Liner LINER NEUTRAL 52X36MM PLUS 4  [employer] [id number] Right 1 Implanted  ?FEM STEM 12/14 TAPER SZ 4 HIP - [id number] Orthopedic Implant FEM STEM 12/14 TAPER SZ 4 HIP  [employer] [id number] Right 1 Implanted  ?HEAD CERAMIC 36 PLUS 8.'5 12 14 '$ - [id number] Hips HEAD CERAMIC 36 PLUS 8.'5 12 14  '$ [employer] [id number] Right 1 Implanted  ? ? ?

## 2021-06-06 ENCOUNTER — Encounter (HOSPITAL_COMMUNITY): Payer: Self-pay | Admitting: Orthopaedic Surgery

## 2021-06-06 DIAGNOSIS — M1611 Unilateral primary osteoarthritis, right hip: Secondary | ICD-10-CM | POA: Diagnosis not present

## 2021-06-06 LAB — CBC
HCT: 34.4 % — ABNORMAL LOW (ref 39.0–52.0)
Hemoglobin: 11.8 g/dL — ABNORMAL LOW (ref 13.0–17.0)
MCH: 31.1 pg (ref 26.0–34.0)
MCHC: 34.3 g/dL (ref 30.0–36.0)
MCV: 90.5 fL (ref 80.0–100.0)
Platelets: 252 10*3/uL (ref 150–400)
RBC: 3.8 MIL/uL — ABNORMAL LOW (ref 4.22–5.81)
RDW: 15.1 % (ref 11.5–15.5)
WBC: 8.8 10*3/uL (ref 4.0–10.5)
nRBC: 0 % (ref 0.0–0.2)

## 2021-06-06 LAB — BASIC METABOLIC PANEL
Anion gap: 10 (ref 5–15)
BUN: 9 mg/dL (ref 6–20)
CO2: 27 mmol/L (ref 22–32)
Calcium: 9 mg/dL (ref 8.9–10.3)
Chloride: 100 mmol/L (ref 98–111)
Creatinine, Ser: 1.03 mg/dL (ref 0.61–1.24)
GFR, Estimated: >60 mL/min (ref >60)
Glucose, Bld: 95 mg/dL (ref 70–99)
Potassium: 4 mmol/L (ref 3.5–5.1)
Sodium: 137 mmol/L (ref 135–145)
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
## 2021-06-06 NOTE — Evaluation (Signed)
Physical Therapy Evaluation ?Patient Details ?Name: Bryan Chambers ?MRN: [id number] ?DOB: [birth year] ?Today's Date: 06/06/2021 ? ?History of Present Illness ? Admitted for RTKA, done 4/3, WBAT, Anterior Approach; has a past medical history of Blood dyscrasia and Prostate cancer ([place]).  ?Clinical Impression ?  ?Pt is s/p THA resulting in the deficits listed below (see PT Problem List). Comes from home where he has prn family assist; 5 steps to enter, no rails;  Presents to PT with R hip pain, decr functional mobility; mingaurd for transfers, supervision for hallway walking, and min assist for stair training; Pt will benefit from skilled PT to increase their independence and safety with mobility to allow discharge to the venue listed below.  ?   ? Overall he is moving well, but is nervous about the notion of going home today; Plan for PM session after pain meds  ? ?Recommendations for follow up therapy are one component of a multi-disciplinary discharge planning process, led by the attending physician.  Recommendations may be updated based on patient status, additional functional criteria and insurance authorization. ? ?Follow Up Recommendations Follow physician's recommendations for discharge plan and follow up therapies ? ?  ?Assistance Recommended at Discharge PRN  ?Patient can return home with the following ? Help with stairs or ramp for entrance ? ?  ?Equipment Recommendations Rolling walker (2 wheels);BSC/3in1  ?Recommendations for Other Services ?    ?  ?Functional Status Assessment Patient has had a recent decline in their functional status and demonstrates the ability to make significant improvements in function in a reasonable and predictable amount of time.  ? ?  ?Precautions / Restrictions Precautions ?Precautions: None ?Precaution Comments: Moves slowly, but no loss of balance ?Restrictions ?Weight Bearing Restrictions: No ?RLE Weight Bearing: Weight bearing as tolerated  ? ?  ? ?Mobility ? Bed Mobility ?  ?  ?   ?  ?  ?  ?  ?  ?  ? ?Transfers ?Overall transfer level: Needs assistance ?Equipment used: Rolling walker (2 wheels) ?Transfers: Sit to/from Stand ?Sit to Stand: Min guard (without physical contact) ?  ?  ?  ?  ?  ?General transfer comment: Cues for hand placement and safety ?  ? ?Ambulation/Gait ?Ambulation/Gait assistance: Supervision ?Gait Distance (Feet): 100 Feet ?Assistive device: Rolling walker (2 wheels) ?Gait Pattern/deviations: Antalgic ?  ?  ?  ?General Gait Details: Heavy dependence on UEs on RW for support; tending to keep hips slightly flexed throughout gait cycle; in a lot of pain, but willing to work on walking ? ?Stairs ?Stairs: Yes ?Stairs assistance: Min assist ?Stair Management: No rails, Backwards, With walker ?Number of Stairs: 5 ?General stair comments: Step-by-step cues for technique; slow moving, but managed stairs well ? ?Wheelchair Mobility ?  ? ?Modified Rankin (Stroke Patients Only) ?  ? ?  ? ?Balance   ?  ?  ?  ?  ?  ?  ?  ?  ?  ?  ?  ?  ?  ?  ?  ?  ?  ?  ?   ? ? ? ?Pertinent Vitals/Pain Pain Assessment ?Pain Assessment: 0-10 ?Pain Score: 9  ?Pain Location: R hip ?Pain Descriptors / Indicators: Burning, Grimacing, Guarding ?Pain Intervention(s): Monitored during session, Patient requesting pain meds-RN notified  ? ? ?Home Living Family/patient expects to be discharged to:: Private residence ?Living Arrangements: Other relatives ?Available Help at Discharge: Available PRN/intermittently ?  ?Home Access: Stairs to enter ?Entrance Stairs-Rails: None ?Entrance Stairs-Number of Steps: 5 ?  ?  Home Layout: One level ?Home Equipment: None ?   ?  ?Prior Function Prior Level of Function : Independent/Modified Independent ?  ?  ?  ?  ?  ?  ?  ?  ?  ? ? ?Hand Dominance  ?   ? ?  ?Extremity/Trunk Assessment  ? Upper Extremity Assessment ?Upper Extremity Assessment: Overall WFL for tasks assessed ?  ? ?Lower Extremity Assessment ?Lower Extremity Assessment: RLE deficits/detail ?RLE Deficits / Details:  Grossly decr AROM and strength, limited by pain postop ?  ? ?   ?Communication  ? Communication: No difficulties  ?Cognition Arousal/Alertness: Awake/alert ?Behavior During Therapy: [facility] for tasks assessed/performed ?Overall Cognitive Status: Within Functional Limits for tasks assessed ?  ?  ?  ?  ?  ?  ?  ?  ?  ?  ?  ?  ?  ?  ?  ?  ?  ?  ?  ? ?  ?General Comments General comments (skin integrity, edema, etc.): Very painful, and pt voiced concern re: going home today; Encouraged pt that if he can walk and manage stairs in this much pain, then moving when his pain is uncer control will be far easier ? ?  ?Exercises    ? ?Assessment/Plan  ?  ?PT Assessment Patient needs continued PT services  ?PT Problem List Decreased strength;Decreased range of motion;Decreased activity tolerance;Decreased mobility;Decreased knowledge of use of DME;Decreased knowledge of precautions;Pain ? ?   ?  ?PT Treatment Interventions DME instruction;Gait training;Stair training;Functional mobility training;Therapeutic activities;Therapeutic exercise;Balance training;Patient/family education   ? ?PT Goals (Current goals can be found in the Care Plan section)  ?Acute Rehab PT Goals ?Patient Stated Goal: Less R hip pain; REports he needs to have his L knee done as well ?PT Goal Formulation: With patient ?Time For Goal Achievement: 06/13/21 ?Potential to Achieve Goals: Good ? ?  ?Frequency 7X/week ?  ? ? ?Co-evaluation   ?  ?  ?  ?  ? ? ?  ?AM-PAC PT "6 Clicks" Mobility  ?Outcome Measure Help needed turning from your back to your side while in a flat bed without using bedrails?: None ?Help needed moving from lying on your back to sitting on the side of a flat bed without using bedrails?: None ?Help needed moving to and from a bed to a chair (including a wheelchair)?: A Little ?Help needed standing up from a chair using your arms (e.g., wheelchair or bedside chair)?: A Little ?Help needed to walk in hospital room?: A Little ?Help needed climbing  3-5 steps with a railing? : A Lot ?6 Click Score: 19 ? ?  ?End of Session Equipment Utilized During Treatment: Gait belt ?Activity Tolerance: Patient tolerated treatment well (even in so much pain) ?Patient left: in chair;with call bell/phone within reach;with nursing/sitter in room ?Nurse Communication: Mobility status;Patient requests pain meds (Gave meds at end of session) ?PT Visit Diagnosis: Pain;Other abnormalities of gait and mobility (R26.89) ?Pain - Right/Left: Right ?Pain - part of body: Hip ?  ? ?Time: 0277-4128 ?PT Time Calculation (min) (ACUTE ONLY): 31 min ? ? ?Charges:     ?PT Treatments ?$Gait Training: 23-37 mins ?  ?   ? ? ?Roney Marion, PT  ?Acute Rehabilitation Services ?Pager [phone] ?Office [phone] ? ? ?Colletta [place] ?06/06/2021, 10:26 AM ? ?

## 2021-06-06 NOTE — Plan of Care (Signed)
Pt doing well. Pt given D/C instructions with verbal understanding. Rx's were sent to the pharmacy by MD. Pt's incision is clean and dry with no sign of infection. Pt's IV was removed prior to D/C. Pt received RW and 3-n-1 from [employer] prior to D/C. Pt's Home Health was arranged by MD office. Pt D/C'd home via wheelchair per MD order. Pt is stable @ D/C and has no other needs at this time. Holli Humbles, RN  ?

## 2021-06-06 NOTE — Progress Notes (Signed)
Physical Therapy Treatment ?Patient Details ?Name: Bryan Chambers ?MRN: [id number] ?DOB: 14-Jan-1966 ?Today's Date: 06/06/2021 ? ? ?History of Present Illness Admitted for RTKA, done 4/3, WBAT, Anterior Approach; has a past medical history of Blood dyscrasia and Prostate cancer ([place]). ? ?  ?PT Comments  ? ? Excellent progress and much smoother gait; reviewed stairs and discussed car transfers and therapeutic exercise; Moving well; Questions solicited and answered; OK for dc home from PT standpoint    ?Recommendations for follow up therapy are one component of a multi-disciplinary discharge planning process, led by the attending physician.  Recommendations may be updated based on patient status, additional functional criteria and insurance authorization. ? ?Follow Up Recommendations ? Follow physician's recommendations for discharge plan and follow up therapies ?  ?  ?Assistance Recommended at Discharge PRN  ?Patient can return home with the following Help with stairs or ramp for entrance ?  ?Equipment Recommendations ? Rolling walker (2 wheels);BSC/3in1  ?  ?Recommendations for Other Services   ? ? ?  ?Precautions / Restrictions Precautions ?Precautions: None ?Restrictions ?RLE Weight Bearing: Weight bearing as tolerated  ?  ? ?Mobility ? Bed Mobility ?Overal bed mobility: Modified Independent ?  ?  ?  ?  ?  ?  ?General bed mobility comments: incr time ?  ? ?Transfers ?Overall transfer level: Modified independent ?Equipment used: Rolling walker (2 wheels) ?Transfers: Sit to/from Stand ?Sit to Stand: Modified independent (Device/Increase time) ?  ?  ?  ?  ?  ?General transfer comment: Cues for hand placement and safety ?  ? ?Ambulation/Gait ?Ambulation/Gait assistance: Supervision, Modified independent (Device/Increase time) ?Gait Distance (Feet): 300 Feet ?Assistive device: Rolling walker (2 wheels) ?Gait Pattern/deviations: Step-through pattern ?  ?  ?  ?General Gait Details: Much smoother steps and more upright  posture ? ? ?Stairs ?Stairs: Yes ?Stairs assistance: Min assist ?Stair Management: No rails, Backwards, With walker ?Number of Stairs: 5 ?General stair comments: Pt able to direct how to assist him with stairs ? ? ?Wheelchair Mobility ?  ? ?Modified Rankin (Stroke Patients Only) ?  ? ? ?  ?Balance   ?  ?  ?  ?  ?  ?  ?  ?  ?  ?  ?  ?  ?  ?  ?  ?  ?  ?  ?  ? ?  ?Cognition Arousal/Alertness: Awake/alert ?Behavior During Therapy: [facility] for tasks assessed/performed ?Overall Cognitive Status: Within Functional Limits for tasks assessed ?  ?  ?  ?  ?  ?  ?  ?  ?  ?  ?  ?  ?  ?  ?  ?  ?  ?  ?  ? ?  ?Exercises   ? ?  ?General Comments General comments (skin integrity, edema, etc.): Discussed car transfer and HEP sheet; Voiced undersatnding ?  ?  ? ?Pertinent Vitals/Pain Pain Assessment ?Pain Assessment: 0-10 ?Pain Score: 5  ?Pain Location: R hip ?Pain Descriptors / Indicators: Aching ?Pain Intervention(s): Monitored during session, Premedicated before session  ? ? ?Home Living   ?  ?  ?  ?  ?  ?  ?  ?  ?  ?   ?  ?Prior Function    ?  ?  ?   ? ?PT Goals (current goals can now be found in the care plan section) Acute Rehab PT Goals ?Patient Stated Goal: Less R hip pain; REports he needs to have his L knee done as well ?PT Goal Formulation: With  patient ?Time For Goal Achievement: 06/13/21 ?Potential to Achieve Goals: Good ?Progress towards PT goals: Progressing toward goals ? ?  ?Frequency ? ? ? 7X/week ? ? ? ?  ?PT Plan Current plan remains appropriate  ? ? ?Co-evaluation   ?  ?  ?  ?  ? ?  ?AM-PAC PT "6 Clicks" Mobility   ?Outcome Measure ? Help needed turning from your back to your side while in a flat bed without using bedrails?: None ?Help needed moving from lying on your back to sitting on the side of a flat bed without using bedrails?: None ?Help needed moving to and from a bed to a chair (including a wheelchair)?: None ?Help needed standing up from a chair using your arms (e.g., wheelchair or bedside chair)?: A  Little ?Help needed to walk in hospital room?: A Little ?Help needed climbing 3-5 steps with a railing? : A Little ?6 Click Score: 21 ? ?  ?End of Session   ?Activity Tolerance: Patient tolerated treatment well ?Patient left: in chair;with call bell/phone within reach ?Nurse Communication: Mobility status (ok for dc home) ?PT Visit Diagnosis: Pain;Other abnormalities of gait and mobility (R26.89) ?Pain - Right/Left: Right ?Pain - part of body: Hip ?  ? ? ?Time: 1610-9604 ?PT Time Calculation (min) (ACUTE ONLY): 19 min ? ?Charges:  $Gait Training: 8-22 mins          ?          ? ?Roney Marion, PT  ?Acute Rehabilitation Services ?Pager [phone] ?Office [phone] ? ? ? ?Colletta [place] ?06/06/2021, 3:43 PM ? ?

## 2021-06-06 NOTE — Progress Notes (Signed)
Subjective: ?1 Day Post-Op Procedure(s) (LRB): ?RIGHT TOTAL HIP ARTHROPLASTY ANTERIOR APPROACH (Right) ?Patient reports pain as mild.   ? ?Objective: ?Vital signs in last 24 hours: ?Temp:  [97.7 ?F (36.5 ?C)-99.4 ?F (37.4 ?C)] 98.9 ?F (37.2 ?C) (04/04 0938) ?Pulse Rate:  [69-101] 92 (04/04 0731) ?Resp:  [12-20] 18 (04/04 0731) ?BP: (127-170)/(86-140) 170/94 (04/04 0731) ?SpO2:  [97 %-100 %] 100 % (04/04 0731) ? ?Intake/Output from previous day: ?04/03 0701 - 04/04 0700 ?In: 1747.2 [P.O.:150; I.V.:1297.2; IV Piggyback:300] ?Out: 1725 [Urine:1425; Blood:300] ?Intake/Output this shift: ?No intake/output data recorded. ? ?Recent Labs  ?  06/06/21 ?1829  ?HGB 11.8*  ? ?Recent Labs  ?  06/06/21 ?9371  ?WBC 8.8  ?RBC 3.80*  ?HCT 34.4*  ?PLT 252  ? ?Recent Labs  ?  06/06/21 ?6967  ?NA 137  ?K 4.0  ?CL 100  ?CO2 27  ?BUN 9  ?CREATININE 1.03  ?GLUCOSE 95  ?CALCIUM 9.0  ? ?No results for input(s): LABPT, INR in the last 72 hours. ? ?Neurologically intact ?Neurovascular intact ?Sensation intact distally ?Intact pulses distally ?Dorsiflexion/Plantar flexion intact ?Incision: dressing C/D/I ?No cellulitis present ?Compartment soft ? ? ?Assessment/Plan: ?1 Day Post-Op Procedure(s) (LRB): ?RIGHT TOTAL HIP ARTHROPLASTY ANTERIOR APPROACH (Right) ?Advance diet ?Up with therapy ?D/C IV fluids ?Discharge home with home health after second PT session as long as pain is under control and he feels safe to go home ?WBAT RLE ?ABLA- mild and stable ? ? ? ? ?Aundra Dubin ?06/06/2021, 8:13 AM ? ?

## 2021-06-20 ENCOUNTER — Ambulatory Visit (INDEPENDENT_AMBULATORY_CARE_PROVIDER_SITE_OTHER): Payer: 59 | Admitting: Orthopaedic Surgery

## 2021-06-20 ENCOUNTER — Encounter: Payer: Self-pay | Admitting: Orthopaedic Surgery

## 2021-06-20 DIAGNOSIS — M7021 Olecranon bursitis, right elbow: Secondary | ICD-10-CM | POA: Diagnosis not present

## 2021-06-20 DIAGNOSIS — Z96641 Presence of right artificial hip joint: Secondary | ICD-10-CM

## 2021-06-20 MED ORDER — BUPIVACAINE HCL 0.5 % IJ SOLN
2.0000 mL | INTRAMUSCULAR | Status: AC | PRN
  Administered 2021-06-20: 2 mL via INTRA_ARTICULAR

## 2021-06-20 MED ORDER — METHYLPREDNISOLONE ACETATE 40 MG/ML IJ SUSP
40.0000 mg | INTRAMUSCULAR | Status: AC | PRN
  Administered 2021-06-20: 40 mg via INTRA_ARTICULAR
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
## 2021-08-02 ENCOUNTER — Telehealth: Payer: Self-pay | Admitting: Orthopaedic Surgery

## 2021-08-02 NOTE — Telephone Encounter (Signed)
Patient left message on voice mail to return his call. Patient did not answer.  Unable to leave voice mail because mailbox is full.

## 2021-08-09 ENCOUNTER — Ambulatory Visit (INDEPENDENT_AMBULATORY_CARE_PROVIDER_SITE_OTHER): Payer: 59 | Admitting: Orthopaedic Surgery

## 2021-08-09 ENCOUNTER — Encounter: Payer: Self-pay | Admitting: Orthopaedic Surgery

## 2021-08-09 ENCOUNTER — Ambulatory Visit (INDEPENDENT_AMBULATORY_CARE_PROVIDER_SITE_OTHER): Payer: 59

## 2021-08-09 VITALS — Ht 69.0 in | Wt 192.0 lb

## 2021-08-09 DIAGNOSIS — M25562 Pain in left knee: Secondary | ICD-10-CM

## 2021-08-09 DIAGNOSIS — Z96641 Presence of right artificial hip joint: Secondary | ICD-10-CM

## 2021-08-09 DIAGNOSIS — G8929 Other chronic pain: Secondary | ICD-10-CM

## 2021-08-09 MED ORDER — ACETAMINOPHEN-CODEINE 300-30 MG PO TABS: 1.0000 | ORAL_TABLET | Freq: Two times a day (BID) | ORAL | 0 refills | Status: DC | PRN

## 2021-08-09 MED ORDER — TRAMADOL HCL 50 MG PO TABS: 50.0000 mg | ORAL_TABLET | Freq: Two times a day (BID) | ORAL | 0 refills | Status: DC | PRN
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
## 2021-08-31 ENCOUNTER — Ambulatory Visit (HOSPITAL_COMMUNITY)
Admission: RE | Admit: 2021-08-31 | Discharge: 2021-08-31 | Disposition: A | Discharge status: Home / Self Care | Payer: 59 | Source: Ambulatory Visit | Attending: Orthopaedic Surgery | Admitting: Orthopaedic Surgery

## 2021-08-31 DIAGNOSIS — G8929 Other chronic pain: Secondary | ICD-10-CM

## 2021-08-31 DIAGNOSIS — M25562 Pain in left knee: Secondary | ICD-10-CM | POA: Diagnosis present

## 2021-09-12 ENCOUNTER — Encounter: Payer: Self-pay | Admitting: Orthopaedic Surgery

## 2021-09-12 ENCOUNTER — Ambulatory Visit (INDEPENDENT_AMBULATORY_CARE_PROVIDER_SITE_OTHER): Payer: 59 | Admitting: Orthopaedic Surgery

## 2021-09-12 ENCOUNTER — Ambulatory Visit (INDEPENDENT_AMBULATORY_CARE_PROVIDER_SITE_OTHER): Payer: 59

## 2021-09-12 DIAGNOSIS — M1712 Unilateral primary osteoarthritis, left knee: Secondary | ICD-10-CM
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
## 2021-10-30 ENCOUNTER — Other Ambulatory Visit: Payer: Self-pay | Admitting: Physician Assistant

## 2021-10-30 MED ORDER — ASPIRIN 81 MG PO TBEC
81.0000 mg | DELAYED_RELEASE_TABLET | Freq: Two times a day (BID) | ORAL | 0 refills | Status: AC
Start: 2021-10-30 — End: 2022-10-30

## 2021-10-30 MED ORDER — DOCUSATE SODIUM 100 MG PO CAPS: 100.0000 mg | ORAL_CAPSULE | Freq: Every day | ORAL | 2 refills | Status: AC | PRN

## 2021-10-30 MED ORDER — SULFAMETHOXAZOLE-TRIMETHOPRIM 800-160 MG PO TABS: 1.0000 | ORAL_TABLET | Freq: Two times a day (BID) | ORAL | 0 refills | Status: AC

## 2021-10-30 MED ORDER — OXYCODONE-ACETAMINOPHEN 5-325 MG PO TABS: 1.0000 | ORAL_TABLET | Freq: Four times a day (QID) | ORAL | 0 refills | Status: AC | PRN

## 2021-10-30 MED ORDER — ONDANSETRON HCL 4 MG PO TABS
4.0000 mg | ORAL_TABLET | Freq: Three times a day (TID) | ORAL | 0 refills | Status: AC | PRN
Start: 2021-10-30 — End: ?

## 2021-10-30 MED ORDER — METHOCARBAMOL 750 MG PO TABS: 750.0000 mg | ORAL_TABLET | Freq: Two times a day (BID) | ORAL | 2 refills | Status: AC | PRN

## 2021-10-31 ENCOUNTER — Encounter (HOSPITAL_COMMUNITY): Payer: Self-pay

## 2021-10-31 ENCOUNTER — Other Ambulatory Visit: Payer: Self-pay

## 2021-10-31 ENCOUNTER — Encounter (HOSPITAL_COMMUNITY)
Admission: RE | Admit: 2021-10-31 | Discharge: 2021-10-31 | Disposition: A | Discharge status: Home / Self Care | Payer: Medicaid Other | Source: Ambulatory Visit | Attending: Orthopaedic Surgery | Admitting: Orthopaedic Surgery

## 2021-10-31 VITALS — BP 134/80 | HR 87 | Temp 98.5°F | Resp 17 | Ht 69.0 in | Wt 188.4 lb

## 2021-10-31 DIAGNOSIS — Z01812 Encounter for preprocedural laboratory examination: Secondary | ICD-10-CM | POA: Insufficient documentation

## 2021-10-31 DIAGNOSIS — M1712 Unilateral primary osteoarthritis, left knee: Secondary | ICD-10-CM | POA: Insufficient documentation

## 2021-10-31 DIAGNOSIS — Z01818 Encounter for other preprocedural examination: Secondary | ICD-10-CM

## 2021-10-31 LAB — CBC
HCT: 38.3 % — ABNORMAL LOW (ref 39.0–52.0)
Hemoglobin: 12.7 g/dL — ABNORMAL LOW (ref 13.0–17.0)
MCH: 29.5 pg (ref 26.0–34.0)
MCHC: 33.2 g/dL (ref 30.0–36.0)
MCV: 89.1 fL (ref 80.0–100.0)
Platelets: 324 10*3/uL (ref 150–400)
RBC: 4.3 MIL/uL (ref 4.22–5.81)
RDW: 16.1 % — ABNORMAL HIGH (ref 11.5–15.5)
WBC: 6.5 10*3/uL (ref 4.0–10.5)
nRBC: 0 % (ref 0.0–0.2)

## 2021-10-31 LAB — SURGICAL PCR SCREEN
MRSA, PCR: POSITIVE — AB
Staphylococcus aureus: POSITIVE — AB

## 2021-10-31 NOTE — Pre-Procedure Instructions (Addendum)
Surgical Instructions    Your procedure is scheduled on Tuesday 11/07/21.   Report to [facility] Main Entrance "A" at 12:00 P.M., then check in with the Admitting office.  Call this number if you have problems the morning of surgery:  [phone]   If you have any questions prior to your surgery date call [phone]: Open Monday-Friday 8am-4pm    Remember:  Do not eat after midnight the night before your surgery  You may drink clear liquids until 11:35 A.M. the morning of your surgery.   Clear liquids allowed are: Water, Non-Citrus Juices (without pulp), Carbonated Beverages, Clear Tea, Black Coffee ONLY (NO MILK, CREAM OR POWDERED CREAMER of any kind), and Gatorade  Patient Instructions  The night before surgery:  No food after midnight. ONLY clear liquids after midnight  The day of surgery (if you do NOT have diabetes):  Drink ONE (1) Pre-Surgery Clear Ensure by 11:35 A.M. the morning of surgery. Drink in one sitting. Do not sip.  This drink was given to you during your hospital  pre-op appointment visit.  Nothing else to drink after completing the  Pre-Surgery Clear Ensure.         If you have questions, please contact your surgeon's office.     Take these medicines the morning of surgery with A SIP OF WATER:   docusate sodium (COLACE)- If needed  methocarbamol (ROBAXIN-750)- If needed  As of today, STOP taking any Aspirin (unless otherwise instructed by your surgeon) Aleve, Naproxen, Ibuprofen, Motrin, Advil, Goody's, BC's, all herbal medications, fish oil, and all vitamins.           Do not wear jewelry or makeup. Do not wear lotions, powders, perfumes/cologne or deodorant. Do not shave 48 hours prior to surgery.  Men may shave face and neck. Do not bring valuables to the hospital. Do not wear nail polish, gel polish, artificial nails, or any other type of covering on natural nails (fingers and toes) If you have artificial nails or gel coating that need to be  removed by a nail salon, please have this removed prior to surgery. Artificial nails or gel coating may interfere with anesthesia's ability to adequately monitor your vital signs.  [facility] is not responsible for any belongings or valuables.    Do NOT Smoke (Tobacco/Vaping)  24 hours prior to your procedure  If you use a CPAP at night, you may bring your mask for your overnight stay.   Contacts, glasses, hearing aids, dentures or partials may not be worn into surgery, please bring cases for these belongings   For patients admitted to the hospital, discharge time will be determined by your treatment team.   Patients discharged the day of surgery will not be allowed to drive home, and someone needs to stay with them for 24 hours.   SURGICAL WAITING ROOM VISITATION Patients having surgery or a procedure may have no more than 2 support people in the waiting area - these visitors may rotate.   Children under the age of 79 must have an adult with them who is not the patient. If the patient needs to stay at the hospital during part of their recovery, the visitor guidelines for inpatient rooms apply. Pre-op nurse will coordinate an appropriate time for 1 support person to accompany patient in pre-op.  This support person may not rotate.   Please refer to the [facility] website for the visitor guidelines for Inpatients (after your surgery is over and you are in a regular  room).    Special instructions:    Oral Hygiene is also important to reduce your risk of infection.  Remember - BRUSH YOUR TEETH THE MORNING OF SURGERY WITH YOUR REGULAR TOOTHPASTE   [facility]- Preparing For Surgery  Before surgery, you can play an important role. Because skin is not sterile, your skin needs to be as free of germs as possible. You can reduce the number of germs on your skin by washing with CHG (chlorahexidine gluconate) Soap before surgery.  CHG is an antiseptic cleaner which kills germs and bonds with the  skin to continue killing germs even after washing.     Please do not use if you have an allergy to CHG or antibacterial soaps. If your skin becomes reddened/irritated stop using the CHG.  Do not shave (including legs and underarms) for at least 48 hours prior to first CHG shower. It is OK to shave your face.  Please follow these instructions carefully.     Shower the NIGHT BEFORE SURGERY and the MORNING OF SURGERY with CHG Soap.   If you chose to wash your hair, wash your hair first as usual with your normal shampoo. After you shampoo, rinse your hair and body thoroughly to remove the shampoo.  Then [employer] and genitals (private parts) with your normal soap and rinse thoroughly to remove soap.  After that Use CHG Soap as you would any other liquid soap. You can apply CHG directly to the skin and wash gently with a scrungie or a clean washcloth.   Apply the CHG Soap to your body ONLY FROM THE NECK DOWN.  Do not use on open wounds or open sores. Avoid contact with your eyes, ears, mouth and genitals (private parts). Wash Face and genitals (private parts)  with your normal soap.   Wash thoroughly, paying special attention to the area where your surgery will be performed.  Thoroughly rinse your body with warm water from the neck down.  DO NOT shower/wash with your normal soap after using and rinsing off the CHG Soap.  Pat yourself dry with a CLEAN TOWEL.  Wear CLEAN PAJAMAS to bed the night before surgery  Place CLEAN SHEETS on your bed the night before your surgery  DO NOT SLEEP WITH PETS.   Day of Surgery:  Take a shower with CHG soap. Wear Clean/Comfortable clothing the morning of surgery Do not apply any deodorants/lotions.   Remember to brush your teeth WITH YOUR REGULAR TOOTHPASTE.    If you received a COVID test during your pre-op visit, it is requested that you wear a mask when out in public, stay away from anyone that may not be feeling well, and notify your surgeon if  you develop symptoms. If you have been in contact with anyone that has tested positive in the last 10 days, please notify your surgeon.    Please read over the following fact sheets that you were given.

## 2021-10-31 NOTE — Progress Notes (Signed)
PCP - Dr. Agustina Caroli Cardiologist - denies  PPM/ICD - n/a  Chest x-ray - n/a EKG - n/a Stress Test - denies ECHO - denies Cardiac Cath - denies  Sleep Study - denies CPAP - n/a  Diabetes- denies  Blood Thinner Instructions: n/a Aspirin Instructions: n/a  ERAS Protcol - Clear liquids until 1135 am day of surgery PRE-SURGERY Ensure or G2- Ensure  COVID TEST- n/a   Anesthesia review: No  Patient denies shortness of breath, fever, cough and chest pain at PAT appointment   All instructions explained to the patient, with a verbal understanding of the material. Patient agrees to go over the instructions while at home for a better understanding. The opportunity to ask questions was provided.

## 2021-10-31 NOTE — Progress Notes (Signed)
LVM for Bryan Chambers, surgery scheduler for Dr. Erlinda Hong regarding the positive MRSA/STAPH pcr results.

## 2021-10-31 NOTE — Progress Notes (Signed)
See results

## 2021-11-01 ENCOUNTER — Telehealth: Payer: Self-pay | Admitting: Orthopaedic Surgery

## 2021-11-01 ENCOUNTER — Other Ambulatory Visit: Payer: Self-pay | Admitting: Physician Assistant

## 2021-11-01 NOTE — Telephone Encounter (Signed)
Minna Merritts with pre-admission at [facility] left message stating patient tested positive for staph and MRSA.  Miachel is scheduled 11-07-21 for left UNI possible TKA.  (4 cases on this date, he is last)

## 2021-11-01 NOTE — Telephone Encounter (Signed)
thanks

## 2021-11-01 NOTE — Progress Notes (Signed)
Sent in bactrim for post-op, added vanc for pre-op in short stay and looks like he is 4th case

## 2021-11-03 NOTE — Progress Notes (Addendum)
Patient was called and informed that surgery time for Tuesday, 11/07/21 was changed to 11:30 o'clock. Patient was instructed to be at the hospital at 09:00 o'clock and to stop clear liquids at 08:30 o'clock. Patient verbalized understanding.

## 2021-11-06 DIAGNOSIS — M1712 Unilateral primary osteoarthritis, left knee: Secondary | ICD-10-CM | POA: Diagnosis present

## 2021-11-07 ENCOUNTER — Encounter (HOSPITAL_COMMUNITY): Payer: Self-pay | Admitting: Anesthesiology

## 2021-11-07 ENCOUNTER — Ambulatory Visit (HOSPITAL_COMMUNITY): Admission: RE | Admit: 2021-11-07 | Payer: 59 | Source: Home / Self Care | Admitting: Orthopaedic Surgery

## 2021-11-07 DIAGNOSIS — M1712 Unilateral primary osteoarthritis, left knee: Secondary | ICD-10-CM

## 2021-11-07 SURGERY — ARTHROPLASTY, KNEE, UNICOMPARTMENTAL
Anesthesia: Spinal | Site: Knee | Laterality: Left

## 2021-11-07 NOTE — Anesthesia Preprocedure Evaluation (Signed)
Anesthesia Evaluation    Reviewed: Allergy & Precautions, Patient's Chart, lab work & pertinent test results  Airway        Dental   Pulmonary neg pulmonary ROS,           Cardiovascular negative cardio ROS       Neuro/Psych negative neurological ROS  negative psych ROS   GI/Hepatic negative GI ROS, Neg liver ROS,   Endo/Other  negative endocrine ROS  Renal/GU negative Renal ROS  negative genitourinary   Musculoskeletal  (+) Arthritis , Osteoarthritis,    Abdominal   Peds  Hematology Hb 12.7, plt 324   Anesthesia Other Findings   Reproductive/Obstetrics negative OB ROS                             Anesthesia Physical Anesthesia Plan  ASA: 2  Anesthesia Plan: MAC, Regional and Spinal   Post-op Pain Management: Tylenol PO (pre-op)* and Toradol IV (intra-op)*   Induction:   PONV Risk Score and Plan: 2 and Propofol infusion and TIVA  Airway Management Planned: Natural Airway and Simple Face Mask  Additional Equipment: None  Intra-op Plan:   Post-operative Plan:   Informed Consent: I have reviewed the patients History and Physical, chart, labs and discussed the procedure including the risks, benefits and alternatives for the proposed anesthesia with the patient or authorized representative who has indicated his/her understanding and acceptance.       Plan Discussed with: CRNA  Anesthesia Plan Comments: (THR done 06/2021 under spinal, no issues )        Anesthesia Quick Evaluation

## 2021-11-16 ENCOUNTER — Telehealth: Payer: Self-pay

## 2021-11-16 NOTE — Telephone Encounter (Signed)
Peer to peer is set up for 11/21/21 @ 1:00. Gave your cell # (primary #) and triage # (secondary #). [phone]#[id number]

## 2021-11-20 ENCOUNTER — Other Ambulatory Visit: Payer: Self-pay | Admitting: Physician Assistant

## 2021-11-21 ENCOUNTER — Other Ambulatory Visit: Payer: Self-pay

## 2021-11-21 NOTE — Telephone Encounter (Signed)
Approved.  [id number]

## 2021-11-22 ENCOUNTER — Encounter: Payer: 59 | Admitting: Orthopaedic Surgery

## 2021-11-23 MED ORDER — TRANEXAMIC ACID 1000 MG/10ML IV SOLN
2000.0000 mg | INTRAVENOUS | Status: AC
  Administered 2021-11-24: 2000 mg via TOPICAL
  Filled 2021-11-23 (×2): qty 20

## 2021-11-23 NOTE — Progress Notes (Signed)
Called pt to give pt updated time of arrival for surgery. Pt had a PAT appt on 10/31/21 and surgery has been moved twice since then. Left message on pt's voicemail to follow all pre-op instructions given to him at his PAT appt. I instructed pt that arrival time is 5:30 AM for a 7:30 AM start time.

## 2021-11-23 NOTE — Anesthesia Preprocedure Evaluation (Addendum)
Anesthesia Evaluation  Patient identified by MRN, date of birth, ID band Patient awake    Reviewed: Allergy & Precautions, NPO status , Patient's Chart, lab work & pertinent test results  History of Anesthesia Complications Negative for: history of anesthetic complications  Airway Mallampati: II  TM Distance: >3 FB Neck ROM: Full    Dental  (+) Teeth Intact   Pulmonary neg pulmonary ROS,    Pulmonary exam normal        Cardiovascular negative cardio ROS Normal cardiovascular exam     Neuro/Psych negative neurological ROS     GI/Hepatic negative GI ROS, Neg liver ROS,   Endo/Other  negative endocrine ROS  Renal/GU negative Renal ROS     Musculoskeletal  (+) Arthritis , Osteoarthritis,    Abdominal   Peds  Hematology  (+) Blood dyscrasia, anemia ,   Anesthesia Other Findings   Reproductive/Obstetrics                           Anesthesia Physical Anesthesia Plan  ASA: 2  Anesthesia Plan: Spinal   Post-op Pain Management: Regional block*, Tylenol PO (pre-op)* and Toradol IV (intra-op)*   Induction:   PONV Risk Score and Plan: 1 and Propofol infusion, Treatment may vary due to age or medical condition, Ondansetron and TIVA  Airway Management Planned: Nasal Cannula and Simple Face Mask  Additional Equipment: None  Intra-op Plan:   Post-operative Plan:   Informed Consent: I have reviewed the patients History and Physical, chart, labs and discussed the procedure including the risks, benefits and alternatives for the proposed anesthesia with the patient or authorized representative who has indicated his/her understanding and acceptance.       Plan Discussed with:   Anesthesia Plan Comments:        Anesthesia Quick Evaluation

## 2021-11-24 ENCOUNTER — Ambulatory Visit (HOSPITAL_COMMUNITY): Payer: Medicare Other | Admitting: Anesthesiology

## 2021-11-24 ENCOUNTER — Ambulatory Visit (HOSPITAL_BASED_OUTPATIENT_CLINIC_OR_DEPARTMENT_OTHER): Payer: Medicare Other | Admitting: Anesthesiology

## 2021-11-24 ENCOUNTER — Observation Stay (HOSPITAL_COMMUNITY): Payer: Medicare Other

## 2021-11-24 ENCOUNTER — Encounter (HOSPITAL_COMMUNITY): Payer: Self-pay | Admitting: Orthopaedic Surgery

## 2021-11-24 ENCOUNTER — Other Ambulatory Visit: Payer: Self-pay

## 2021-11-24 ENCOUNTER — Encounter (HOSPITAL_COMMUNITY)
Admission: RE | Disposition: A | Discharge status: Home Health | Payer: Self-pay | Source: Ambulatory Visit | Attending: Orthopaedic Surgery

## 2021-11-24 ENCOUNTER — Telehealth: Payer: Self-pay | Admitting: Orthopaedic Surgery

## 2021-11-24 ENCOUNTER — Observation Stay (HOSPITAL_COMMUNITY)
Admission: RE | Admit: 2021-11-24 | Discharge: 2021-11-26 | Disposition: A | Discharge status: Home Health | Payer: Medicare Other | Source: Ambulatory Visit | Attending: Orthopaedic Surgery | Admitting: Orthopaedic Surgery

## 2021-11-24 DIAGNOSIS — Z8546 Personal history of malignant neoplasm of prostate: Secondary | ICD-10-CM | POA: Insufficient documentation

## 2021-11-24 DIAGNOSIS — Z79899 Other long term (current) drug therapy: Secondary | ICD-10-CM | POA: Diagnosis not present

## 2021-11-24 DIAGNOSIS — X58XXXA Exposure to other specified factors, initial encounter: Secondary | ICD-10-CM | POA: Insufficient documentation

## 2021-11-24 DIAGNOSIS — S83232A Complex tear of medial meniscus, current injury, left knee, initial encounter: Secondary | ICD-10-CM | POA: Insufficient documentation

## 2021-11-24 DIAGNOSIS — M2242 Chondromalacia patellae, left knee: Secondary | ICD-10-CM | POA: Diagnosis not present

## 2021-11-24 DIAGNOSIS — M1712 Unilateral primary osteoarthritis, left knee: Secondary | ICD-10-CM | POA: Diagnosis present

## 2021-11-24 DIAGNOSIS — Z7982 Long term (current) use of aspirin: Secondary | ICD-10-CM | POA: Insufficient documentation

## 2021-11-24 DIAGNOSIS — Z96652 Presence of left artificial knee joint: Secondary | ICD-10-CM

## 2021-11-24 DIAGNOSIS — Z96641 Presence of right artificial hip joint: Secondary | ICD-10-CM | POA: Insufficient documentation

## 2021-11-24 DIAGNOSIS — M179 Osteoarthritis of knee, unspecified: Secondary | ICD-10-CM | POA: Diagnosis present

## 2021-11-24 HISTORY — PX: PARTIAL KNEE ARTHROPLASTY: SHX2174

## 2021-11-24 LAB — COMPREHENSIVE METABOLIC PANEL
ALT: 30 U/L (ref 0–44)
AST: 28 U/L (ref 15–41)
Albumin: 3.6 g/dL (ref 3.5–5.0)
Alkaline Phosphatase: 59 U/L (ref 38–126)
Anion gap: 7 (ref 5–15)
BUN: 9 mg/dL (ref 6–20)
CO2: 28 mmol/L (ref 22–32)
Calcium: 9.3 mg/dL (ref 8.9–10.3)
Chloride: 105 mmol/L (ref 98–111)
Creatinine, Ser: 1.15 mg/dL (ref 0.61–1.24)
GFR, Estimated: >60 mL/min (ref >60)
Glucose, Bld: 92 mg/dL (ref 70–99)
Potassium: 3.5 mmol/L (ref 3.5–5.1)
Sodium: 140 mmol/L (ref 135–145)
Total Bilirubin: 0.6 mg/dL (ref 0.3–1.2)
Total Protein: 6.6 g/dL (ref 6.5–8.1)

## 2021-11-24 LAB — CBC
HCT: 37.9 % — ABNORMAL LOW (ref 39.0–52.0)
Hemoglobin: 12.2 g/dL — ABNORMAL LOW (ref 13.0–17.0)
MCH: 29.4 pg (ref 26.0–34.0)
MCHC: 32.2 g/dL (ref 30.0–36.0)
MCV: 91.3 fL (ref 80.0–100.0)
Platelets: 306 10*3/uL (ref 150–400)
RBC: 4.15 MIL/uL — ABNORMAL LOW (ref 4.22–5.81)
RDW: 14.9 % (ref 11.5–15.5)
WBC: 5.8 10*3/uL (ref 4.0–10.5)
nRBC: 0 % (ref 0.0–0.2)

## 2021-11-24 SURGERY — ARTHROPLASTY, KNEE, UNICOMPARTMENTAL
Anesthesia: Spinal | Site: Knee | Laterality: Left

## 2021-11-24 MED ORDER — DOCUSATE SODIUM 100 MG PO CAPS
100.0000 mg | ORAL_CAPSULE | Freq: Two times a day (BID) | ORAL | Status: DC
  Administered 2021-11-24 – 2021-11-26 (×4): 100 mg via ORAL
  Filled 2021-11-24 (×5): qty 1

## 2021-11-24 MED ORDER — OXYCODONE HCL 5 MG PO TABS: 5.0000 mg | ORAL_TABLET | Freq: Once | ORAL | Status: DC | PRN

## 2021-11-24 MED ORDER — OXYCODONE HCL 5 MG/5ML PO SOLN: 5.0000 mg | Freq: Once | ORAL | Status: DC | PRN

## 2021-11-24 MED ORDER — SODIUM CHLORIDE 0.9 % IV SOLN: INTRAVENOUS | Status: DC

## 2021-11-24 MED ORDER — PROPOFOL 10 MG/ML IV BOLUS
INTRAVENOUS | Status: DC | PRN
  Administered 2021-11-24: 50 mg via INTRAVENOUS

## 2021-11-24 MED ORDER — ACETAMINOPHEN 500 MG PO TABS
1000.0000 mg | ORAL_TABLET | Freq: Once | ORAL | Status: AC
  Administered 2021-11-24: 1000 mg via ORAL
  Filled 2021-11-24: qty 2

## 2021-11-24 MED ORDER — FERROUS SULFATE 325 (65 FE) MG PO TABS
325.0000 mg | ORAL_TABLET | Freq: Every day | ORAL | Status: DC
  Administered 2021-11-25 – 2021-11-26 (×2): 325 mg via ORAL
  Filled 2021-11-24 (×2): qty 1

## 2021-11-24 MED ORDER — METHOCARBAMOL 500 MG PO TABS
500.0000 mg | ORAL_TABLET | Freq: Four times a day (QID) | ORAL | Status: DC | PRN
  Administered 2021-11-25: 500 mg via ORAL
  Filled 2021-11-24 (×2): qty 1

## 2021-11-24 MED ORDER — BUPIVACAINE-MELOXICAM ER 400-12 MG/14ML IJ SOLN
INTRAMUSCULAR | Status: DC | PRN
  Administered 2021-11-24: 400 mg

## 2021-11-24 MED ORDER — KETOROLAC TROMETHAMINE 15 MG/ML IJ SOLN
15.0000 mg | Freq: Four times a day (QID) | INTRAMUSCULAR | Status: AC
  Administered 2021-11-24 – 2021-11-25 (×4): 15 mg via INTRAVENOUS
  Filled 2021-11-24 (×4): qty 1

## 2021-11-24 MED ORDER — PHENOL 1.4 % MT LIQD: 1.0000 | OROMUCOSAL | Status: DC | PRN

## 2021-11-24 MED ORDER — ACETAMINOPHEN 325 MG PO TABS: 325.0000 mg | ORAL_TABLET | Freq: Four times a day (QID) | ORAL | Status: DC | PRN

## 2021-11-24 MED ORDER — AMISULPRIDE (ANTIEMETIC) 5 MG/2ML IV SOLN: 10.0000 mg | Freq: Once | INTRAVENOUS | Status: DC | PRN

## 2021-11-24 MED ORDER — OXYCODONE HCL ER 10 MG PO T12A
10.0000 mg | EXTENDED_RELEASE_TABLET | Freq: Two times a day (BID) | ORAL | Status: DC
  Administered 2021-11-24 – 2021-11-26 (×5): 10 mg via ORAL
  Filled 2021-11-24 (×5): qty 1

## 2021-11-24 MED ORDER — VANCOMYCIN HCL 1000 MG IV SOLR
INTRAVENOUS | Status: AC
  Filled 2021-11-24: qty 20

## 2021-11-24 MED ORDER — ONDANSETRON HCL 4 MG/2ML IJ SOLN: 4.0000 mg | Freq: Four times a day (QID) | INTRAMUSCULAR | Status: DC | PRN

## 2021-11-24 MED ORDER — ONDANSETRON HCL 4 MG/2ML IJ SOLN: 4.0000 mg | Freq: Once | INTRAMUSCULAR | Status: DC | PRN

## 2021-11-24 MED ORDER — CEFAZOLIN SODIUM-DEXTROSE 2-4 GM/100ML-% IV SOLN
2.0000 g | INTRAVENOUS | Status: AC
  Administered 2021-11-24: 2 g via INTRAVENOUS
  Filled 2021-11-24: qty 100

## 2021-11-24 MED ORDER — PRONTOSAN WOUND IRRIGATION OPTIME
TOPICAL | Status: DC | PRN
  Administered 2021-11-24: 1

## 2021-11-24 MED ORDER — BUPIVACAINE IN DEXTROSE 0.75-8.25 % IT SOLN
INTRATHECAL | Status: DC | PRN
  Administered 2021-11-24: 1.8 mL via INTRATHECAL

## 2021-11-24 MED ORDER — FENTANYL CITRATE (PF) 100 MCG/2ML IJ SOLN: 25.0000 ug | INTRAMUSCULAR | Status: DC | PRN

## 2021-11-24 MED ORDER — FENTANYL CITRATE (PF) 100 MCG/2ML IJ SOLN
INTRAMUSCULAR | Status: AC
  Filled 2021-11-24: qty 2

## 2021-11-24 MED ORDER — BUPIVACAINE-MELOXICAM ER 400-12 MG/14ML IJ SOLN
INTRAMUSCULAR | Status: AC
  Filled 2021-11-24: qty 1

## 2021-11-24 MED ORDER — OXYCODONE HCL 5 MG PO TABS
10.0000 mg | ORAL_TABLET | ORAL | Status: DC | PRN
  Administered 2021-11-25: 10 mg via ORAL
  Administered 2021-11-25: 15 mg via ORAL
  Administered 2021-11-25: 10 mg via ORAL
  Administered 2021-11-25: 15 mg via ORAL
  Filled 2021-11-24: qty 2
  Filled 2021-11-24: qty 3
  Filled 2021-11-24 (×2): qty 2
  Filled 2021-11-24: qty 3

## 2021-11-24 MED ORDER — ROPIVACAINE HCL 5 MG/ML IJ SOLN
INTRAMUSCULAR | Status: DC | PRN
  Administered 2021-11-24: 30 mL via PERINEURAL

## 2021-11-24 MED ORDER — VANCOMYCIN HCL IN DEXTROSE 1-5 GM/200ML-% IV SOLN
1000.0000 mg | Freq: Once | INTRAVENOUS | Status: AC
Start: 2021-11-24 — End: 2021-11-24
  Administered 2021-11-24: 1000 mg via INTRAVENOUS
  Filled 2021-11-24: qty 200

## 2021-11-24 MED ORDER — CHLORHEXIDINE GLUCONATE 0.12 % MT SOLN
OROMUCOSAL | Status: AC
  Administered 2021-11-24: 15 mL via OROMUCOSAL
  Filled 2021-11-24: qty 15

## 2021-11-24 MED ORDER — MIDAZOLAM HCL 2 MG/2ML IJ SOLN
INTRAMUSCULAR | Status: DC | PRN
  Administered 2021-11-24: 2 mg via INTRAVENOUS

## 2021-11-24 MED ORDER — LACTATED RINGERS IV SOLN: INTRAVENOUS | Status: DC

## 2021-11-24 MED ORDER — OXYCODONE HCL 5 MG PO TABS
5.0000 mg | ORAL_TABLET | ORAL | Status: DC | PRN
  Administered 2021-11-25: 10 mg via ORAL
  Administered 2021-11-26: 5 mg via ORAL
  Filled 2021-11-24: qty 2

## 2021-11-24 MED ORDER — STERILE WATER FOR IRRIGATION IR SOLN
Status: DC | PRN
  Administered 2021-11-24: 1000 mL

## 2021-11-24 MED ORDER — ASPIRIN 81 MG PO CHEW
81.0000 mg | CHEWABLE_TABLET | Freq: Two times a day (BID) | ORAL | Status: DC
  Administered 2021-11-24 – 2021-11-26 (×4): 81 mg via ORAL
  Filled 2021-11-24 (×4): qty 1

## 2021-11-24 MED ORDER — FENTANYL CITRATE (PF) 250 MCG/5ML IJ SOLN
INTRAMUSCULAR | Status: AC
  Filled 2021-11-24: qty 5

## 2021-11-24 MED ORDER — VANCOMYCIN HCL 1000 MG IV SOLR
INTRAVENOUS | Status: DC | PRN
  Administered 2021-11-24: 1000 mg

## 2021-11-24 MED ORDER — ORAL CARE MOUTH RINSE: 15.0000 mL | Freq: Once | OROMUCOSAL | Status: AC

## 2021-11-24 MED ORDER — MIDAZOLAM HCL 2 MG/2ML IJ SOLN
INTRAMUSCULAR | Status: AC
  Filled 2021-11-24: qty 2

## 2021-11-24 MED ORDER — 0.9 % SODIUM CHLORIDE (POUR BTL) OPTIME
TOPICAL | Status: DC | PRN
  Administered 2021-11-24: 1000 mL

## 2021-11-24 MED ORDER — METOCLOPRAMIDE HCL 5 MG PO TABS: 5.0000 mg | ORAL_TABLET | Freq: Three times a day (TID) | ORAL | Status: DC | PRN

## 2021-11-24 MED ORDER — DEXAMETHASONE SODIUM PHOSPHATE 10 MG/ML IJ SOLN
10.0000 mg | Freq: Once | INTRAMUSCULAR | Status: AC
  Administered 2021-11-25: 10 mg via INTRAVENOUS
  Filled 2021-11-24: qty 1

## 2021-11-24 MED ORDER — CEFAZOLIN SODIUM-DEXTROSE 2-4 GM/100ML-% IV SOLN
2.0000 g | Freq: Four times a day (QID) | INTRAVENOUS | Status: AC
  Administered 2021-11-24 (×2): 2 g via INTRAVENOUS
  Filled 2021-11-24 (×2): qty 100

## 2021-11-24 MED ORDER — SODIUM CHLORIDE 0.9 % IR SOLN
Status: DC | PRN
  Administered 2021-11-24: 1000 mL

## 2021-11-24 MED ORDER — HYDROMORPHONE HCL 1 MG/ML IJ SOLN
0.5000 mg | INTRAMUSCULAR | Status: DC | PRN
  Administered 2021-11-24: 1 mg via INTRAVENOUS
  Filled 2021-11-24: qty 1

## 2021-11-24 MED ORDER — METHOCARBAMOL 1000 MG/10ML IJ SOLN: 500.0000 mg | Freq: Four times a day (QID) | INTRAVENOUS | Status: DC | PRN

## 2021-11-24 MED ORDER — POVIDONE-IODINE 10 % EX SWAB
2.0000 | Freq: Once | CUTANEOUS | Status: AC
  Administered 2021-11-24: 2 via TOPICAL

## 2021-11-24 MED ORDER — FENTANYL CITRATE (PF) 250 MCG/5ML IJ SOLN
INTRAMUSCULAR | Status: DC | PRN
  Administered 2021-11-24: 50 ug via INTRAVENOUS

## 2021-11-24 MED ORDER — ONDANSETRON HCL 4 MG PO TABS: 4.0000 mg | ORAL_TABLET | Freq: Four times a day (QID) | ORAL | Status: DC | PRN

## 2021-11-24 MED ORDER — METOCLOPRAMIDE HCL 5 MG/ML IJ SOLN: 5.0000 mg | Freq: Three times a day (TID) | INTRAMUSCULAR | Status: DC | PRN

## 2021-11-24 MED ORDER — ACETAMINOPHEN 500 MG PO TABS
1000.0000 mg | ORAL_TABLET | Freq: Four times a day (QID) | ORAL | Status: AC
  Administered 2021-11-24 – 2021-11-25 (×4): 1000 mg via ORAL
  Filled 2021-11-24 (×4): qty 2

## 2021-11-24 MED ORDER — TRANEXAMIC ACID-NACL 1000-0.7 MG/100ML-% IV SOLN
1000.0000 mg | Freq: Once | INTRAVENOUS | Status: AC
  Administered 2021-11-24: 1000 mg via INTRAVENOUS
  Filled 2021-11-24: qty 100

## 2021-11-24 MED ORDER — CHLORHEXIDINE GLUCONATE 0.12 % MT SOLN: 15.0000 mL | Freq: Once | OROMUCOSAL | Status: AC

## 2021-11-24 MED ORDER — PROPOFOL 500 MG/50ML IV EMUL
INTRAVENOUS | Status: DC | PRN
  Administered 2021-11-24: 100 ug/kg/min via INTRAVENOUS

## 2021-11-24 MED ORDER — PHENYLEPHRINE HCL-NACL 20-0.9 MG/250ML-% IV SOLN
INTRAVENOUS | Status: DC | PRN
  Administered 2021-11-24: 50 ug/min via INTRAVENOUS

## 2021-11-24 MED ORDER — ALLOPURINOL 300 MG PO TABS: 300.0000 mg | ORAL_TABLET | Freq: Every day | ORAL | Status: DC

## 2021-11-24 MED ORDER — ONDANSETRON HCL 4 MG/2ML IJ SOLN
INTRAMUSCULAR | Status: DC | PRN
  Administered 2021-11-24: 4 mg via INTRAVENOUS

## 2021-11-24 MED ORDER — TRANEXAMIC ACID-NACL 1000-0.7 MG/100ML-% IV SOLN
1000.0000 mg | INTRAVENOUS | Status: AC
  Administered 2021-11-24: 1000 mg via INTRAVENOUS
  Filled 2021-11-24: qty 100

## 2021-11-24 MED ORDER — MENTHOL 3 MG MT LOZG: 1.0000 | LOZENGE | OROMUCOSAL | Status: DC | PRN

## 2021-11-24 SURGICAL SUPPLY — 76 items
ADH SKN CLS APL DERMABOND .7 (GAUZE/BANDAGES/DRESSINGS) ×1
ALCOHOL 70% 16 OZ (MISCELLANEOUS) ×1 IMPLANT
BAG COUNTER SPONGE SURGICOUNT (BAG) ×1 IMPLANT
BAG DECANTER FOR FLEXI CONT (MISCELLANEOUS) ×1 IMPLANT
BAG SPNG CNTER NS LX DISP (BAG) ×1
BANDAGE ESMARK 6X9 LF (GAUZE/BANDAGES/DRESSINGS) IMPLANT
BLADE SAG 18X100X1.27 (BLADE) IMPLANT
BNDG CMPR 9X6 STRL LF SNTH (GAUZE/BANDAGES/DRESSINGS)
BNDG ESMARK 6X9 LF (GAUZE/BANDAGES/DRESSINGS)
BOWL SMART MIX CTS (DISPOSABLE) ×1 IMPLANT
COMP FEM PS 9 LT STD (Joint) ×1 IMPLANT
COMP TIB PS G 0D LT (Joint) ×1 IMPLANT
COMPONENT FEM PS 9 LT STD (Joint) IMPLANT
COMPONET TIB PS G 0D LT (Joint) IMPLANT
COOLER ICEMAN CLASSIC (MISCELLANEOUS) ×1 IMPLANT
COVER SURGICAL LIGHT HANDLE (MISCELLANEOUS) ×1 IMPLANT
CUFF TOURN SGL QUICK 34 (TOURNIQUET CUFF) ×1
CUFF TOURN SGL QUICK 42 (TOURNIQUET CUFF) IMPLANT
CUFF TRNQT CYL 34X4.125X (TOURNIQUET CUFF) ×1 IMPLANT
DERMABOND ADVANCED .7 DNX12 (GAUZE/BANDAGES/DRESSINGS) IMPLANT
DRAPE EXTREMITY T 121X128X90 (DISPOSABLE) ×1 IMPLANT
DRAPE HALF SHEET 40X57 (DRAPES) ×1 IMPLANT
DRAPE INCISE IOBAN 66X45 STRL (DRAPES) IMPLANT
DRAPE ORTHO SPLIT 77X108 STRL (DRAPES) ×2
DRAPE POUCH INSTRU U-SHP 10X18 (DRAPES) ×1 IMPLANT
DRAPE SURG ORHT 6 SPLT 77X108 (DRAPES) ×2 IMPLANT
DRAPE U-SHAPE 47X51 STRL (DRAPES) ×2 IMPLANT
DRESSING AQUACEL AG SP 3.5X10 (GAUZE/BANDAGES/DRESSINGS) IMPLANT
DRSG AQUACEL AG SP 3.5X10 (GAUZE/BANDAGES/DRESSINGS) ×1
DURAPREP 26ML APPLICATOR (WOUND CARE) ×3 IMPLANT
ELECT CAUTERY BLADE 6.4 (BLADE) ×1 IMPLANT
ELECT REM PT RETURN 9FT ADLT (ELECTROSURGICAL) ×1
ELECTRODE REM PT RTRN 9FT ADLT (ELECTROSURGICAL) ×1 IMPLANT
GLOVE BIO SURGEON STRL SZ7 (GLOVE) IMPLANT
GLOVE BIOGEL PI IND STRL 7.0 (GLOVE) ×2 IMPLANT
GLOVE BIOGEL PI IND STRL 7.5 (GLOVE) ×5 IMPLANT
GLOVE SURG SYN 7.5  E (GLOVE) ×2
GLOVE SURG SYN 7.5 E (GLOVE) ×2 IMPLANT
GLOVE SURG SYN 7.5 PF PI (GLOVE) ×2 IMPLANT
GLOVE SURG UNDER POLY LF SZ7 (GLOVE) ×19 IMPLANT
GLOVE SURG UNDER POLY LF SZ7.5 (GLOVE) ×9 IMPLANT
GOWN STRL REIN XL XLG (GOWN DISPOSABLE) ×2 IMPLANT
GOWN STRL REUS W/ TWL LRG LVL3 (GOWN DISPOSABLE) ×1 IMPLANT
GOWN STRL REUS W/TWL LRG LVL3 (GOWN DISPOSABLE) ×1
HANDPIECE INTERPULSE COAX TIP (DISPOSABLE) ×1
HDLS TROCR DRIL PIN KNEE 75 (PIN) ×1
HOOD PEEL AWAY FLYTE STAYCOOL (MISCELLANEOUS) ×2 IMPLANT
INSERT ARTISURF S8-11 18X22X14 (Insert) IMPLANT
INSERTER TIP PARTIAL KNEE (MISCELLANEOUS) IMPLANT
KIT BASIN OR (CUSTOM PROCEDURE TRAY) ×1 IMPLANT
KIT TURNOVER KIT B (KITS) ×1 IMPLANT
MANIFOLD NEPTUNE II (INSTRUMENTS) ×1 IMPLANT
MARKER SKIN DUAL TIP RULER LAB (MISCELLANEOUS) ×1 IMPLANT
NDL SPNL 18GX3.5 QUINCKE PK (NEEDLE) ×1 IMPLANT
NEEDLE SPNL 18GX3.5 QUINCKE PK (NEEDLE) ×2 IMPLANT
NS IRRIG 1000ML POUR BTL (IV SOLUTION) ×1 IMPLANT
PACK TOTAL JOINT (CUSTOM PROCEDURE TRAY) ×1 IMPLANT
PAD ARMBOARD 7.5X6 YLW CONV (MISCELLANEOUS) ×2 IMPLANT
PAD COLD SHLDR WRAP-ON (PAD) ×1 IMPLANT
PATELLA STD SZ 38X10 (Miscellaneous) IMPLANT
PIN DRILL HDLS TROCAR 75 4PK (PIN) IMPLANT
SAW OSC TIP CART 19.5X105X1.3 (SAW) ×1 IMPLANT
SCREW HEADED 33MM KNEE (MISCELLANEOUS) IMPLANT
SCREW HEADED 48MM KNEE (MISCELLANEOUS) IMPLANT
SET HNDPC FAN SPRY TIP SCT (DISPOSABLE) ×1 IMPLANT
SOLUTION PRONTOSAN WOUND 350ML (IRRIGATION / IRRIGATOR) IMPLANT
STAPLER VISISTAT 35W (STAPLE) ×1 IMPLANT
SUCTION FRAZIER HANDLE 10FR (MISCELLANEOUS) ×1
SUCTION TUBE FRAZIER 10FR DISP (MISCELLANEOUS) ×1 IMPLANT
SUT ETHILON 2 0 FS 18 (SUTURE) IMPLANT
SUT VIC AB 1 CTX 27 (SUTURE) IMPLANT
SYR 50ML LL SCALE MARK (SYRINGE) ×2 IMPLANT
TOWEL GREEN STERILE (TOWEL DISPOSABLE) ×1 IMPLANT
TOWEL GREEN STERILE FF (TOWEL DISPOSABLE) ×1 IMPLANT
TRAY FOLEY MTR SLVR 16FR STAT (SET/KITS/TRAYS/PACK) ×1 IMPLANT
UNDERPAD 30X36 HEAVY ABSORB (UNDERPADS AND DIAPERS) ×1 IMPLANT

## 2021-11-24 NOTE — TOC Initial Note (Signed)
Transition of Care [facility]) - Initial/Assessment Note    Patient Details  Name: Bryan Chambers MRN: [id number] Date of Birth: [birth year]  Transition of [facility]) CM/SW Contact:    Sharin Mons, RN Phone Number: 11/24/2021, 1:17 PM  Clinical Narrative:                 -  Left total knee arthroplasty, 9/22 From home with sister, Hassan Rowan. PTA independent with ADL's, no DME usage. Orders noted for home health services  and DME needs. Pt agreeable to home health services. Pt without provider preference. Referral made with [facility] and accepted.  NCM confirmed pt's address:[address]  [place], [place]  [address] Referral made with [employer] for RW and BSC. Equipment will be delivered to bedside prior to d/c. Pt states sister will assist with needs once d/c. Pt has transportation to home. Pt without Rx med concerns.  TOC following and will continue to assist with needs....  Expected Discharge Plan:  (Resides with sister, Hassan Rowan) Barriers to Discharge: Continued Medical Work up   Patient Goals and CMS Choice     Choice offered to / list presented to : Patient  Expected Discharge Plan and Services Expected Discharge Plan:  (Resides with sister, Hassan Rowan)                         DME Arranged: 3-N-1, Gilford Rile rolling DME Agency: [employer] Date DME Agency Contacted: 11/24/21 Time DME Agency Contacted: [phone] Representative spoke with at DME Agency: [place] Arranged: PT, OT [place] Agency: [place] Date [place]: 11/24/21 Time [place]: 6967 Representative spoke with at [place]: [place] Arrangements/Services       Do you feel safe going back to the place where you live?: Yes               Activities of Daily Living      Permission Sought/Granted                  Emotional Assessment              Admission diagnosis:  OA (osteoarthritis) of knee [M17.9] Status post total left knee replacement  [Z96.652] Patient Active Problem List   Diagnosis Date Noted   OA (osteoarthritis) of knee 11/24/2021   Status post total left knee replacement 11/24/2021   Primary osteoarthritis of left knee 11/06/2021   Olecranon bursitis, right elbow 06/20/2021   Status post total replacement of right hip 06/05/2021   Olecranon bursitis of right elbow 05/30/2021   Primary osteoarthritis of right hip 04/18/2021   PCP:  Horald Pollen, MD Pharmacy:   [facility] [place] [place] [address] Phone: [phone] Fax: [place], [place] [address] Phone: [phone] Fax: [phone]     Social Determinants of Health (SDOH) Interventions    Readmission Risk Interventions     No data to display

## 2021-11-24 NOTE — Plan of Care (Signed)
  Problem: Education: Goal: Knowledge of General Education information will improve Description: Including pain rating scale, medication(s)/side effects and non-pharmacologic comfort measures Outcome: Progressing   Problem: Clinical Measurements: Goal: Ability to maintain clinical measurements within normal limits will improve Outcome: Progressing Goal: Will remain free from infection Outcome: Progressing   Problem: Nutrition: Goal: Adequate nutrition will be maintained Outcome: Progressing   Problem: Coping: Goal: Level of anxiety will decrease Outcome: Progressing   Problem: Elimination: Goal: Will not experience complications related to bowel motility Outcome: Progressing   Problem: Pain Managment: Goal: General experience of comfort will improve Outcome: Progressing

## 2021-11-24 NOTE — Anesthesia Procedure Notes (Signed)
Spinal  Patient location during procedure: OR Reason for block: surgical anesthesia Staffing Performed: anesthesiologist  Anesthesiologist: Wallice Granville E, MD Performed by: Lorenna Lurry E, MD Authorized by: Cadence Haslam E, MD   Preanesthetic Checklist Completed: patient identified, IV checked, risks and benefits discussed, surgical consent, monitors and equipment checked, pre-op evaluation and timeout performed Spinal Block Patient position: sitting Prep: DuraPrep and site prepped and draped Patient monitoring: continuous pulse ox, blood pressure and heart rate Approach: midline Location: L3-4 Injection technique: single-shot Needle Needle type: Pencan  Needle gauge: 24 G Needle length: 10 cm Assessment Events: CSF return Additional Notes Functioning IV was confirmed and monitors were applied. Sterile prep and drape, including hand hygiene and sterile gloves were used. The patient was positioned and the spine was prepped. The skin was anesthetized with lidocaine.  Free flow of clear CSF was obtained prior to injecting local anesthetic into the CSF. The needle was carefully withdrawn. The patient tolerated the procedure well.      

## 2021-11-24 NOTE — Op Note (Signed)
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 CPT [phone]  Surgeon: N. Eduard Roux, MD  Assist: Madalyn Rob, PA-C; necessary for the timely completion of procedure and due to complexity of procedure.  Anesthesia: Spinal, regional  Tourniquet time: see anesthesia record  Implants used: Zimmer persona pressfit Femur: CR 9 Tibia: G Patella: 38 mm Polyethylene: 10 mm, MC  Indication: Bryan Chambers is a 56 y.o. year old male with a history of knee pain. Having failed conservative management, the patient elected to proceed with a total knee arthroplasty.  We have reviewed the risk and benefits of the surgery and they elected to proceed after voicing understanding.  Procedure:  After informed consent was obtained and understanding of the risk were voiced including but not limited to bleeding, infection, damage to surrounding structures including nerves and vessels, blood clots, leg length inequality and the failure to achieve desired results, the operative extremity was marked with verbal confirmation of the patient in the holding area.   The patient was then brought to the operating room and transported to the operating room table in the supine position.  A tourniquet was applied to the operative extremity around the upper thigh. The operative limb was then prepped and draped in the usual sterile fashion and preoperative antibiotics were administered.  A time out was performed prior to the start of surgery confirming the correct extremity, preoperative antibiotic administration, as well as team members, implants and instruments available for the case.  Correct surgical site was also confirmed with preoperative radiographs. The limb was then elevated for exsanguination and the tourniquet was inflated. A midline incision was made and a standard medial parapatellar approach was performed.  The patella was prepared and sized to a 38 mm.  A cover was placed on the patella for protection from retractors.  We then turned our attention to the femur. Posterior cruciate ligament was sacrificed. Start site was drilled in the femur and the intramedullary distal femoral cutting guide was placed, set at 5 degrees valgus, taking 10 mm of distal resection. The distal cut was made. Osteophytes were then removed.   Next, the proximal tibial cutting guide was placed with appropriate slope, varus/valgus alignment and depth of resection. The proximal tibial cut was made taking 4 mm off the low side. Gap blocks were then used to assess the extension gap and alignment, and appropriate soft tissue releases were performed. Attention was turned back to the femur, which was sized using the sizing guide to a size 9 standard. Appropriate rotation of the femoral component was determined using epicondylar axis, Whiteside's line, and assessing the flexion gap under ligament tension. The appropriate size 4-in-1 cutting block was placed and cuts were made.  Posterior femoral osteophytes and uncapped bone were then removed with the curved osteotome.  Trial components were placed, and stability was checked in full extension, mid-flexion, and deep flexion. Proper tibial rotation was determined and marked.  The patella tracked well without a lateral release. Trial components were then removed and tibial preparation performed.  The tibia was sized for a size G component.  The bony surfaces were irrigated with a pulse lavage and then dried. Final components were placed.  The stability of the construct was re-evaluated throughout a  range of motion and found to be acceptable. The trial liner was  removed, the knee was copiously irrigated, and the knee was re-evaluated for any excess bone debris. The real polyethylene liner, 10 mm thick, was inserted and checked to ensure the locking mechanism had engaged appropriately. The tourniquet was deflated and hemostasis was achieved. The wound was irrigated with normal saline.  One gram of vancomycin powder was placed in the surgical bed.  Topical 0.25% bupivacaine and meloxicam was placed in the joint for postoperative pain.  Capsular closure was performed with a #1 vicryl, subcutaneous fat closed with a 0 vicryl suture, then subcutaneous tissue closed with interrupted 2.0 vicryl suture. The skin was then closed with a 2.0 nylon and dermabond. A sterile dressing was applied.  The patient was awakened in the operating room and taken to recovery in stable condition. All sponge, needle, and instrument counts were correct at the end of the case.  [name] was necessary for opening, closing, retracting, limb positioning and overall facilitation and completion of the surgery.  Position: supine  Complications: none.  Time Out: performed   Drains/Packing: none  Estimated blood loss: minimal  Returned to Recovery Room: in good condition.   Antibiotics: yes   Mechanical VTE (DVT) Prophylaxis: sequential compression devices, TED thigh-high  Chemical VTE (DVT) Prophylaxis: aspirin  Fluid Replacement  Crystalloid: see anesthesia record Blood: none  FFP: none   Specimens Removed: 1 to pathology   Sponge and Instrument Count Correct? yes   PACU: portable radiograph - knee AP and Lateral   Plan/RTC: Return in 2 weeks for wound check.   Weight Bearing/Load Lower Extremity: full   Implant Name Type Inv. Item Serial No. Manufacturer Lot No. LRB No. Used Action  PATELLA STD SZ [id number] - [id number] Miscellaneous PATELLA STD SZ 38X10  ZIMMER RECON(ORTH,TRAU,BIO,SG) [id number] Left 1 Implanted  INSERT ARTISURF S8-11 [id number] - [id number] Insert  INSERT ARTISURF S8-11 [id number]  ZIMMER RECON(ORTH,TRAU,BIO,SG) [id number] Left 1 Implanted  COMP TIB PS G 0D LT - [id number] Joint COMP TIB PS G 0D LT  ZIMMER RECON(ORTH,TRAU,BIO,SG) [id number] Left 1 Implanted  COMP FEM PS 9 LT STD - [id number] Joint COMP FEM PS 9 LT STD  ZIMMER RECON(ORTH,TRAU,BIO,SG) [id number] Left 1 Implanted    N. Eduard Roux, MD [facility] 8:57 AM

## 2021-11-24 NOTE — Transfer of Care (Signed)
Immediate Anesthesia Transfer of Care Note  Patient: Bryan Chambers  Procedure(s) Performed: TOTAL KNEE ARTHROPLASTY (Left: Knee)  Patient Location: PACU  Anesthesia Type:MAC combined with regional for post-op pain  Level of Consciousness: awake, alert  and oriented  Airway & Oxygen Therapy: Patient Spontanous Breathing  Post-op Assessment: Report given to RN and Post -op Vital signs reviewed and stable  Post vital signs: Reviewed and stable  Last Vitals:  Vitals Value Taken Time  BP 135/99 11/24/21 0939  Temp    Pulse 63 11/24/21 0944  Resp 16 11/24/21 0944  SpO2 96 % 11/24/21 0944  Vitals shown include unvalidated device data.  Last Pain:  Vitals:   11/24/21 0609  TempSrc:   PainSc: 6          Complications: No notable events documented.

## 2021-11-24 NOTE — Discharge Instructions (Signed)

## 2021-11-24 NOTE — Telephone Encounter (Signed)
Please see the following message from [place] with [employer]:   Iven Finn! Just FYI on this patient. I received this from one of my guys:  Royer Cristobal (Dr.Xu, Sx 11/24/21, DOB 06-01-2065) did not answer the door when I arrived for our scheduled set-up appt with him. I was unable to reach him by phone or text when I arrived as well. I left a door-tag on his door saying that we missed him for set-up. Please just be aware if the doctor's office ask [place] what happened.    [address] [place], [place] [address] T: [phone] F: [phone] [email or web address]

## 2021-11-24 NOTE — Evaluation (Signed)
Physical Therapy Evaluation Patient Details Name: Bryan Chambers MRN: [id number] DOB: [birth year] Today's Date: 11/24/2021  History of Present Illness  Patient is a 56 yo male presenting to the hosptial for L TKA. PMH includes:R THR, and prostate cancer  Clinical Impression  Pt admitted secondary to problem above with deficits below. Pt tolerated mobility well this session. Requiring min guard for safety for mobility tasks using RW. Reviewed knee precautions. Will continue to follow acutely.        Recommendations for follow up therapy are one component of a multi-disciplinary discharge planning process, led by the attending physician.  Recommendations may be updated based on patient status, additional functional criteria and insurance authorization.  Follow Up Recommendations Follow physician's recommendations for discharge plan and follow up therapies      Assistance Recommended at Discharge Intermittent Supervision/Assistance  Patient can return home with the following  Assist for transportation;Assistance with cooking/housework    Equipment Recommendations None recommended by PT  Recommendations for Other Services       Functional Status Assessment Patient has had a recent decline in their functional status and demonstrates the ability to make significant improvements in function in a reasonable and predictable amount of time.     Precautions / Restrictions Precautions Precautions: Knee;Fall Precaution Booklet Issued: No Precaution Comments: verbally reviewed no pillow behind knee Restrictions Weight Bearing Restrictions: Yes LLE Weight Bearing: Weight bearing as tolerated      Mobility  Bed Mobility Overal bed mobility: Needs Assistance Bed Mobility: Supine to Sit     Supine to sit: Supervision     General bed mobility comments: minimal increased time    Transfers Overall transfer level: Needs assistance Equipment used: Rolling walker (2 wheels) Transfers: Sit  to/from Stand Sit to Stand: Min guard           General transfer comment: for safety, good use of RW throughout    Ambulation/Gait Ambulation/Gait assistance: Min guard Gait Distance (Feet): 75 Feet Assistive device: Rolling walker (2 wheels) Gait Pattern/deviations: Step-to pattern, Step-through pattern, Decreased weight shift to left Gait velocity: Decreased     General Gait Details: Overall good tolerance. Good step height and began initiating heel to toe pattern.  Stairs            Wheelchair Mobility    Modified Rankin (Stroke Patients Only)       Balance Overall balance assessment: Mild deficits observed, not formally tested                                           Pertinent Vitals/Pain Pain Assessment Pain Assessment: Faces Faces Pain Scale: Hurts little more Pain Location: L knee Pain Descriptors / Indicators: Discomfort, Grimacing Pain Intervention(s): Limited activity within patient's tolerance, Monitored during session, Repositioned    Home Living Family/patient expects to be discharged to:: Private residence Living Arrangements: Other relatives (sister) Available Help at Discharge: Available PRN/intermittently Type of Home: House Home Access: Stairs to enter Entrance Stairs-Rails: Right Entrance Stairs-Number of Steps: 5   Home Layout: One level Home Equipment: Conservation officer, nature (2 wheels);BSC/3in1;Cane - single point      Prior Function Prior Level of Function : Independent/Modified Independent;Driving             Mobility Comments: independent ADLs Comments: independent     Hand Dominance        Extremity/Trunk Assessment   Upper Extremity  Assessment Upper Extremity Assessment: Defer to OT evaluation    Lower Extremity Assessment Lower Extremity Assessment: LLE deficits/detail LLE Deficits / Details: Deficits consistent with post op pain and weakness.    Cervical / Trunk Assessment Cervical / Trunk  Assessment: Normal  Communication   Communication: No difficulties  Cognition Arousal/Alertness: Awake/alert Behavior During Therapy: WFL for tasks assessed/performed Overall Cognitive Status: Within Functional Limits for tasks assessed                                          General Comments      Exercises     Assessment/Plan    PT Assessment Patient needs continued PT services  PT Problem List Decreased strength;Decreased range of motion;Decreased activity tolerance;Decreased balance;Decreased mobility;Decreased knowledge of use of DME;Decreased knowledge of precautions;Pain       PT Treatment Interventions DME instruction;Gait training;Stair training;Functional mobility training;Therapeutic activities;Therapeutic exercise;Balance training;Patient/family education    PT Goals (Current goals can be found in the Care Plan section)  Acute Rehab PT Goals Patient Stated Goal: to go home PT Goal Formulation: With patient Time For Goal Achievement: 12/08/21 Potential to Achieve Goals: Good    Frequency 7X/week     Co-evaluation               AM-PAC PT "6 Clicks" Mobility  Outcome Measure Help needed turning from your back to your side while in a flat bed without using bedrails?: None Help needed moving from lying on your back to sitting on the side of a flat bed without using bedrails?: A Little Help needed moving to and from a bed to a chair (including a wheelchair)?: A Little Help needed standing up from a chair using your arms (e.g., wheelchair or bedside chair)?: A Little Help needed to walk in hospital room?: A Little Help needed climbing 3-5 steps with a railing? : A Little 6 Click Score: 19    End of Session Equipment Utilized During Treatment: Gait belt Activity Tolerance: Patient tolerated treatment well Patient left: in chair;with call bell/phone within reach Nurse Communication: Mobility status PT Visit Diagnosis: Other abnormalities  of gait and mobility (R26.89);Pain Pain - Right/Left: Left Pain - part of body: Knee    Time: 0175-1025 PT Time Calculation (min) (ACUTE ONLY): 19 min   Charges:   PT Evaluation $PT Eval Low Complexity: 1 Low          Lou Miner, DPT  Acute Rehabilitation Services  Office: [phone]   Rudean Hitt 11/24/2021, 4:03 PM

## 2021-11-24 NOTE — Anesthesia Procedure Notes (Signed)
Anesthesia Regional Block: Adductor canal block   Pre-Anesthetic Checklist: , timeout performed,  Correct Patient, Correct Site, Correct Laterality,  Correct Procedure, Correct Position, site marked,  Risks and benefits discussed,  Surgical consent,  Pre-op evaluation,  At surgeon's request and post-op pain management  Laterality: Left  Prep: chloraprep       Needles:  Injection technique: Single-shot  Needle Type: Echogenic Stimulator Needle     Needle Length: 10cm  Needle Gauge: 20     Additional Needles:   Procedures:,,,, ultrasound used (permanent image in chart),,    Narrative:  Start time: 11/24/2021 6:54 AM End time: 11/24/2021 6:57 AM Injection made incrementally with aspirations every 5 mL.  Performed by: Personally  Anesthesiologist: Lidia Collum, MD  Additional Notes: Standard monitors applied. Skin prepped. Good needle visualization with ultrasound. Injection made in 5cc increments with no resistance to injection. Patient tolerated the procedure well.

## 2021-11-24 NOTE — Evaluation (Signed)
Occupational Therapy Evaluation Patient Details Name: Bryan Chambers MRN: [id number] DOB: [birth year] Today's Date: 11/24/2021   History of Present Illness Patient is a 56 yo male presenting to the hosptial for L TKA. PMH includes:R THR   Clinical Impression   Prior to this admission, patient living with his sister, but independent with ADLs, IADLs, and driving. Currently, patient is min guard for ADLs and transfers and presenting with decreased activity tolerance and L knee pain. OT recommending follow up per physician's recommendations, OT will continue to follow acutely.      Recommendations for follow up therapy are one component of a multi-disciplinary discharge planning process, led by the attending physician.  Recommendations may be updated based on patient status, additional functional criteria and insurance authorization.   Follow Up Recommendations  Follow physician's recommendations for discharge plan and follow up therapies    Assistance Recommended at Discharge Intermittent Supervision/Assistance  Patient can return home with the following A little help with walking and/or transfers;A little help with bathing/dressing/bathroom;Assistance with cooking/housework;Assist for transportation;Help with stairs or ramp for entrance    Functional Status Assessment  Patient has had a recent decline in their functional status and demonstrates the ability to make significant improvements in function in a reasonable and predictable amount of time.  Equipment Recommendations  None recommended by OT (Patient has DME needed)    Recommendations for Other Services       Precautions / Restrictions Precautions Precautions: Knee;Fall Precaution Booklet Issued: No Precaution Comments: verbally reviewed no pillow behind knee Restrictions Weight Bearing Restrictions: No      Mobility Bed Mobility Overal bed mobility: Needs Assistance Bed Mobility: Supine to Sit     Supine to sit:  Supervision     General bed mobility comments: minimal increased time    Transfers Overall transfer level: Needs assistance Equipment used: Rolling walker (2 wheels) Transfers: Sit to/from Stand Sit to Stand: Min guard           General transfer comment: for safety, good use of RW throughout      Balance Overall balance assessment: Mild deficits observed, not formally tested                                         ADL either performed or assessed with clinical judgement   ADL Overall ADL's : Needs assistance/impaired Eating/Feeding: Set up;Sitting   Grooming: Set up;Sitting   Upper Body Bathing: Set up;Sitting   Lower Body Bathing: Minimal assistance;Sitting/lateral leans;Sit to/from stand   Upper Body Dressing : Set up;Sitting   Lower Body Dressing: Minimal assistance;Sitting/lateral leans;Sit to/from stand   Toilet Transfer: Min guard;Ambulation;Rolling walker (2 wheels)   Toileting- Clothing Manipulation and Hygiene: Min guard;Sit to/from stand;Sitting/lateral lean       Functional mobility during ADLs: Min guard;Rolling walker (2 wheels) General ADL Comments: Patient presenting with pain in L knee, and decreased activity tolerance     Vision Baseline Vision/History: 0 No visual deficits Ability to See in Adequate Light: 0 Adequate Patient Visual Report: No change from baseline       Perception     Praxis      Pertinent Vitals/Pain Pain Assessment Pain Assessment: Faces Faces Pain Scale: Hurts little more Pain Location: L knee Pain Descriptors / Indicators: Discomfort, Grimacing Pain Intervention(s): Limited activity within patient's tolerance, Monitored during session, Premedicated before session, Repositioned     Hand Dominance  Extremity/Trunk Assessment Upper Extremity Assessment Upper Extremity Assessment: Overall WFL for tasks assessed   Lower Extremity Assessment Lower Extremity Assessment: Defer to PT  evaluation   Cervical / Trunk Assessment Cervical / Trunk Assessment: Normal   Communication Communication Communication: No difficulties   Cognition Arousal/Alertness: Awake/alert Behavior During Therapy: WFL for tasks assessed/performed Overall Cognitive Status: Within Functional Limits for tasks assessed                                       General Comments       Exercises     Shoulder Instructions      Home Living Family/patient expects to be discharged to:: Private residence Living Arrangements: Other relatives (sister) Available Help at Discharge: Available PRN/intermittently Type of Home: House Home Access: Stairs to enter Technical brewer of Steps: 5 Entrance Stairs-Rails: Right Home Layout: One level     Bathroom Shower/Tub: Teacher, early years/pre: Standard     Home Equipment: Conservation officer, nature (2 wheels);BSC/3in1;Cane - single point          Prior Functioning/Environment Prior Level of Function : Independent/Modified Independent;Driving             Mobility Comments: independent ADLs Comments: independent        OT Problem List: Decreased strength;Decreased range of motion;Decreased activity tolerance;Impaired balance (sitting and/or standing);Pain;Decreased coordination      OT Treatment/Interventions: Self-care/ADL training;Therapeutic exercise;Energy conservation;DME and/or AE instruction;Therapeutic activities;Balance training;Patient/family education    OT Goals(Current goals can be found in the care plan section) Acute Rehab OT Goals Patient Stated Goal: to get home and feel better OT Goal Formulation: With patient Time For Goal Achievement: 12/08/21 Potential to Achieve Goals: Good ADL Goals Pt Will Perform Lower Body Bathing: Independently Pt Will Perform Lower Body Dressing: Independently Pt Will Transfer to Toilet: Independently Pt Will Perform Toileting - Clothing Manipulation and hygiene:  Independently  OT Frequency: Min 2X/week    Co-evaluation              AM-PAC OT "6 Clicks" Daily Activity     Outcome Measure Help from another person eating meals?: A Little Help from another person taking care of personal grooming?: A Little Help from another person toileting, which includes using toliet, bedpan, or urinal?: A Little Help from another person bathing (including washing, rinsing, drying)?: A Little Help from another person to put on and taking off regular upper body clothing?: A Little Help from another person to put on and taking off regular lower body clothing?: A Little 6 Click Score: 18   End of Session Equipment Utilized During Treatment: Gait belt;Rolling walker (2 wheels) Nurse Communication: Mobility status  Activity Tolerance: Patient tolerated treatment well Patient left: in chair;with call bell/phone within reach  OT Visit Diagnosis: Unsteadiness on feet (R26.81);Pain Pain - Right/Left: Left Pain - part of body: Knee                Time: 2440-1027 OT Time Calculation (min): 17 min Charges:  OT General Charges $OT Visit: 1 Visit OT Evaluation $OT Eval Low Complexity: 1 Low  Corinne Ports E. Orvetta Danielski, OTR/L Acute Rehabilitation Services [phone]   Ascencion Dike 11/24/2021, 3:43 PM
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
## 2021-11-24 NOTE — Anesthesia Postprocedure Evaluation (Signed)
Anesthesia Post Note  Patient: Bryan Chambers  Procedure(s) Performed: TOTAL KNEE ARTHROPLASTY (Left: Knee)     Patient location during evaluation: PACU Anesthesia Type: Spinal Level of consciousness: awake and alert Pain management: pain level controlled Vital Signs Assessment: post-procedure vital signs reviewed and stable Respiratory status: spontaneous breathing, nonlabored ventilation and respiratory function stable Cardiovascular status: blood pressure returned to baseline and stable Postop Assessment: no apparent nausea or vomiting, spinal receding, no headache and no backache Anesthetic complications: no   No notable events documented.  Last Vitals:  Vitals:   11/24/21 1100 11/24/21 1115  BP: (!) 164/91 (!) 159/101  Pulse: (!) 53 (!) 59  Resp: 11 10  Temp:    SpO2: 100% 100%    Last Pain:  Vitals:   11/24/21 1100  TempSrc:   PainSc: 0-No pain                 Lidia Collum

## 2021-11-25 DIAGNOSIS — M1712 Unilateral primary osteoarthritis, left knee: Secondary | ICD-10-CM | POA: Diagnosis not present

## 2021-11-25 LAB — CBC
HCT: 32.9 % — ABNORMAL LOW (ref 39.0–52.0)
Hemoglobin: 10.8 g/dL — ABNORMAL LOW (ref 13.0–17.0)
MCH: 29.5 pg (ref 26.0–34.0)
MCHC: 32.8 g/dL (ref 30.0–36.0)
MCV: 89.9 fL (ref 80.0–100.0)
Platelets: 249 10*3/uL (ref 150–400)
RBC: 3.66 MIL/uL — ABNORMAL LOW (ref 4.22–5.81)
RDW: 14.4 % (ref 11.5–15.5)
WBC: 7.9 10*3/uL (ref 4.0–10.5)
nRBC: 0 % (ref 0.0–0.2)

## 2021-11-25 MED ORDER — VANCOMYCIN HCL IN DEXTROSE 1-5 GM/200ML-% IV SOLN
1000.0000 mg | Freq: Two times a day (BID) | INTRAVENOUS | Status: AC
  Administered 2021-11-25 (×2): 1000 mg via INTRAVENOUS
  Filled 2021-11-25 (×2): qty 200

## 2021-11-25 MED ORDER — ACETAMINOPHEN 325 MG PO TABS: 650.0000 mg | ORAL_TABLET | ORAL | Status: DC | PRN

## 2021-11-25 NOTE — Progress Notes (Signed)
Physical Therapy Treatment Patient Details Name: Bryan Chambers MRN: [id number] DOB: Dec 12, 1965 Today's Date: 11/25/2021   History of Present Illness Patient is a 56 yo male presenting to the hosptial for L TKA. PMH includes:R THR, and prostate cancer    PT Comments    Pt supine in bed.  He reports he had a set back early this am in regards to pain management.  Pt progressing well with better pain control.  Applied ice man machine post session for cryotherapy and educated on positioning and resting in supported extension.  Will continue to follow per POC.  Plan for stair training this am.     Recommendations for follow up therapy are one component of a multi-disciplinary discharge planning process, led by the attending physician.  Recommendations may be updated based on patient status, additional functional criteria and insurance authorization.  Follow Up Recommendations  Follow physician's recommendations for discharge plan and follow up therapies     Assistance Recommended at Discharge Intermittent Supervision/Assistance  Patient can return home with the following Assist for transportation;Assistance with cooking/housework   Equipment Recommendations  None recommended by PT    Recommendations for Other Services       Precautions / Restrictions Precautions Precautions: Knee;Fall Precaution Booklet Issued: No Precaution Comments: verbally reviewed no pillow behind knee ( bed flexed and educated on resting in supported extension with pillow under ankle) Restrictions Weight Bearing Restrictions: Yes LLE Weight Bearing: Weight bearing as tolerated     Mobility  Bed Mobility Overal bed mobility: Needs Assistance Bed Mobility: Supine to Sit, Sit to Supine     Supine to sit: Modified independent (Device/Increase time)          Transfers Overall transfer level: Modified independent                 General transfer comment: Stood without RW with no difficulty     Ambulation/Gait Ambulation/Gait assistance: Supervision Gait Distance (Feet): 120 Feet Assistive device: Rolling walker (2 wheels) Gait Pattern/deviations: Step-through pattern, Decreased weight shift to left, Decreased dorsiflexion - left Gait velocity: Decreased     General Gait Details: Cues for L heel strike to encourage knee extension in stance phase.   Stairs             Wheelchair Mobility    Modified Rankin (Stroke Patients Only)       Balance Overall balance assessment: Mild deficits observed, not formally tested                                          Cognition Arousal/Alertness: Awake/alert Behavior During Therapy: WFL for tasks assessed/performed Overall Cognitive Status: Within Functional Limits for tasks assessed                                          Exercises Total Joint Exercises Ankle Circles/Pumps: AROM, Both, 20 reps, Supine Quad Sets: AROM, Left, 10 reps, Supine Heel Slides: AROM, Left, 10 reps, Supine Hip ABduction/ADduction: AROM, Left, 10 reps, Supine Straight Leg Raises: AROM, Left, 10 reps, Supine, Limitations Straight Leg Raises Limitations: L extensor lag Goniometric ROM: 7-83 L knee    General Comments        Pertinent Vitals/Pain Pain Assessment Pain Assessment: 0-10 Pain Score: 4  Pain Location: L knee Pain Descriptors /  Indicators: Discomfort, Grimacing Pain Intervention(s): Monitored during session, Repositioned, Ice applied    Home Living                          Prior Function            PT Goals (current goals can now be found in the care plan section) Acute Rehab PT Goals Patient Stated Goal: to go home Potential to Achieve Goals: Good Progress towards PT goals: Progressing toward goals    Frequency    7X/week      PT Plan Current plan remains appropriate    Co-evaluation              AM-PAC PT "6 Clicks" Mobility   Outcome Measure   Help needed turning from your back to your side while in a flat bed without using bedrails?: None Help needed moving from lying on your back to sitting on the side of a flat bed without using bedrails?: None Help needed moving to and from a bed to a chair (including a wheelchair)?: None Help needed standing up from a chair using your arms (e.g., wheelchair or bedside chair)?: None Help needed to walk in hospital room?: A Little Help needed climbing 3-5 steps with a railing? : A Little 6 Click Score: 22    End of Session Equipment Utilized During Treatment: Gait belt Activity Tolerance: Patient tolerated treatment well Patient left: in bed;with call bell/phone within reach Nurse Communication: Mobility status (ice applied) PT Visit Diagnosis: Other abnormalities of gait and mobility (R26.89);Pain Pain - Right/Left: Left Pain - part of body: Knee     Time: 1610-9604 PT Time Calculation (min) (ACUTE ONLY): 21 min  Charges:  $Gait Training: 8-22 mins                     Bryan Chambers , PTA Acute Rehabilitation Services Office [phone]    Bryan Chambers Bryan Chambers 11/25/2021, 11:06 AM

## 2021-11-25 NOTE — Progress Notes (Signed)
Pt s/p L TKA, surgical dressing saturated outside the aquacel borders, drainage on bed pad, pt not in any distress and pain tolerable,  site cleansed w/ dry gauze, aquacel replaced w/ slightly longer dx, pt tolerated well, will monitor closely

## 2021-11-25 NOTE — Progress Notes (Signed)
Orthopedic Tech Progress Note Patient Details:  Bryan Chambers May 18, 1965 [id number]  CPM Left Knee CPM Left Knee: On Left Knee Flexion (Degrees): 60 Left Knee Extension (Degrees): 0  Post Interventions Patient Tolerated: Well  Bryan Chambers Bryan Chambers 11/25/2021, 6:16 PM

## 2021-11-25 NOTE — Progress Notes (Signed)
Foley dc'd per MD orders. No bleeding or discomfort noted. Pt tolerated well. Pt has ambulated x1 assist. Call bell in reach, will monitor

## 2021-11-25 NOTE — Progress Notes (Signed)
   Subjective:  Patient reports pain as had breakthrough pain this morning.  Overall doing well. Ambulated twice already.    Objective:   VITALS:   Vitals:   11/24/21 1700 11/24/21 2042 11/25/21 0025 11/25/21 0428  BP: (!) 160/96 (!) 167/102 (!) 178/102 (!) 178/94  Pulse: 67 87 89 81  Resp: '16 16 18 16  '$ Temp: 97.8 F (36.6 C)  100.3 F (37.9 C) 98.5 F (36.9 C)  TempSrc: Oral  Oral   SpO2: 100% 100% 96% 97%  Weight:      Height:        Sensation intact distally Intact pulses distally Dorsiflexion/Plantar flexion intact Incision: dressing C/D/I and no drainage   Lab Results  Component Value Date   WBC 7.9 11/25/2021   HGB 10.8 (L) 11/25/2021   HCT 32.9 (L) 11/25/2021   MCV 89.9 11/25/2021   PLT 249 11/25/2021     Assessment/Plan:  1 Day Post-Op   - Expected postop acute blood loss anemia - will monitor for symptoms - Up with PT/OT - DVT ppx - SCDs, ambulation, aspirin - WBAT operative extremity - Pain control - Discharge planning - will keep here for today for pain control and more PT; anticipate d/c home tomorrow around lunch time  Eduard Roux 11/25/2021, 9:32 AM

## 2021-11-25 NOTE — Progress Notes (Signed)
Physical Therapy Treatment Patient Details Name: Bryan Chambers MRN: [id number] DOB: 1965-12-23 Today's Date: 11/25/2021   History of Present Illness Patient is a 56 yo male presenting to the hosptial for L TKA. PMH includes:R THR, and prostate cancer    PT Comments    Pt supine in bed on arrival this session.  Pt eager to mobilize and reports pain is better controlled after pain meds kicked in.  He performed stair training well.  He is motivated to go home tomorrow am.     Recommendations for follow up therapy are one component of a multi-disciplinary discharge planning process, led by the attending physician.  Recommendations may be updated based on patient status, additional functional criteria and insurance authorization.  Follow Up Recommendations  Follow physician's recommendations for discharge plan and follow up therapies     Assistance Recommended at Discharge Intermittent Supervision/Assistance  Patient can return home with the following Assist for transportation;Assistance with cooking/housework   Equipment Recommendations  None recommended by PT    Recommendations for Other Services       Precautions / Restrictions Precautions Precautions: Knee;Fall Precaution Booklet Issued: No Precaution Comments: verbally reviewed no pillow behind knee and resting in supported extension with pillow under ankle) Restrictions Weight Bearing Restrictions: Yes LLE Weight Bearing: Weight bearing as tolerated     Mobility  Bed Mobility Overal bed mobility: Needs Assistance Bed Mobility: Supine to Sit, Sit to Supine     Supine to sit: Modified independent (Device/Increase time)          Transfers Overall transfer level: Modified independent Equipment used: Rolling walker (2 wheels) Transfers: Sit to/from Stand Sit to Stand: Modified independent (Device/Increase time)           General transfer comment: Stood without RW with no difficulty     Ambulation/Gait Ambulation/Gait assistance: Scientist, forensic (Feet): 120 Feet Assistive device: Rolling walker (2 wheels) Gait Pattern/deviations: Step-through pattern, Decreased weight shift to left, Decreased dorsiflexion - left Gait velocity: Decreased     General Gait Details: Cues for L heel strike to encourage knee extension in stance phase.   Stairs Stairs: Yes Stairs assistance: Supervision Stair Management: Step to pattern, Alternating pattern, Forwards, Two rails Number of Stairs: 10 General stair comments: Performed vary height without difficulty cues for squencing but able to perform recipriocal pattern on stairs as well.   Wheelchair Mobility    Modified Rankin (Stroke Patients Only)       Balance Overall balance assessment: Mild deficits observed, not formally tested                                          Cognition Arousal/Alertness: Awake/alert Behavior During Therapy: WFL for tasks assessed/performed Overall Cognitive Status: Within Functional Limits for tasks assessed                                             General Comments        Pertinent Vitals/Pain Pain Assessment Pain Assessment: 0-10 Pain Score: 3  Pain Location: L knee Pain Descriptors / Indicators: Discomfort, Grimacing Pain Intervention(s): Monitored during session, Repositioned, Premedicated before session    Home Living  Prior Function            PT Goals (current goals can now be found in the care plan section) Acute Rehab PT Goals Patient Stated Goal: to go home Potential to Achieve Goals: Good Progress towards PT goals: Progressing toward goals    Frequency    7X/week      PT Plan Current plan remains appropriate    Co-evaluation              AM-PAC PT "6 Clicks" Mobility   Outcome Measure  Help needed turning from your back to your side while in a flat bed without  using bedrails?: None Help needed moving from lying on your back to sitting on the side of a flat bed without using bedrails?: None Help needed moving to and from a bed to a chair (including a wheelchair)?: None Help needed standing up from a chair using your arms (e.g., wheelchair or bedside chair)?: None Help needed to walk in hospital room?: A Little Help needed climbing 3-5 steps with a railing? : A Little 6 Click Score: 22    End of Session Equipment Utilized During Treatment: Gait belt Activity Tolerance: Patient tolerated treatment well Patient left: in bed;with call bell/phone within reach Nurse Communication: Mobility status (ice applied) PT Visit Diagnosis: Other abnormalities of gait and mobility (R26.89);Pain Pain - Right/Left: Left Pain - part of body: Knee     Time: 5797-2820 PT Time Calculation (min) (ACUTE ONLY): 21 min  Charges:  $Gait Training: 8-22 mins                     Erasmo Leventhal , PTA Acute Rehabilitation Services Office [phone]    Cristela Blue 11/25/2021, 3:09 PM

## 2021-11-25 NOTE — Progress Notes (Signed)
PT Cancellation Note  Patient Details Name: Bryan Chambers MRN: [id number] DOB: 1966/01/28   Cancelled Treatment:    Reason Eval/Treat Not Completed: (P) Pain limiting ability to participate (RN bringing meds and refusing tx at this time until meds are on board will f/u when meds are on board.)   Veleka Djordjevic Bryan Chambers 11/25/2021, 2:25 PM  Erasmo Leventhal , PTA [place] Office [phone]

## 2021-11-26 ENCOUNTER — Encounter (HOSPITAL_COMMUNITY): Payer: Self-pay | Admitting: Orthopaedic Surgery

## 2021-11-26 DIAGNOSIS — M1712 Unilateral primary osteoarthritis, left knee: Secondary | ICD-10-CM | POA: Diagnosis not present

## 2021-11-26 NOTE — TOC Transition Note (Addendum)
Transition of Care [facility]) - CM/SW Discharge Note   Patient Details  Name: Bryan Chambers MRN: [id number] Date of Birth: [birth year]  Transition of Care [facility]) CM/SW Contact:  Bartholomew Crews, RN Phone Number: [phone] 11/26/2021, 7:45 AM   Clinical Narrative:     Patient to transition home today. Previous RNCM arranged HH PT/OT - orders placed on Friday, and DME arranged via [place]. Noted patient is able to arrange own transportation. Liaison at [facility] notified of transition home today. No further TOC needs identified at this time.   Final next level of care: [place] Barriers to Discharge: No Barriers Identified   Patient Goals and CMS Choice     Choice offered to / list presented to : Patient  Discharge Placement                       Discharge Plan and Services                DME Arranged: 3-N-1, Walker rolling DME Agency: [employer] Date DME Agency Contacted: 11/24/21 Time DME Agency Contacted: [id number] Representative spoke with at DME Agency: Jodell Cipro HH Arranged: PT, OT [place] Agency: [place] Date [place]: 11/24/21 Time [place]: [id number] Representative spoke with at [place]: [place] Determinants of Health ([place]) Interventions     Readmission Risk Interventions     No data to display

## 2021-11-26 NOTE — Progress Notes (Signed)
Physical Therapy Treatment Patient Details Name: Bryan Chambers MRN: [id number] DOB: [birth year] Today's Date: 11/26/2021   History of Present Illness Patient is a 56 yo male presenting to the hosptial for L TKA. PMH includes:R THR, and prostate cancer    PT Comments    Pt met his physical therapy goals during his inpatient stay. Pt ambulating 250 ft with rolling walker, modI; demonstrates good step through pattern and left heel strike. Reviewed HEP for LLE strengthening and ROM, activity recommendations, car transfer technique, cryotherapy, and DME progression. Pt with no further questions/concerns. Adequate for d/c home from PT perspective.     Recommendations for follow up therapy are one component of a multi-disciplinary discharge planning process, led by the attending physician.  Recommendations may be updated based on patient status, additional functional criteria and insurance authorization.  Follow Up Recommendations  Follow physician's recommendations for discharge plan and follow up therapies     Assistance Recommended at Discharge Intermittent Supervision/Assistance  Patient can return home with the following Assist for transportation;Assistance with cooking/housework   Equipment Recommendations  None recommended by PT    Recommendations for Other Services       Precautions / Restrictions Precautions Precautions: Knee;Fall Restrictions Weight Bearing Restrictions: Yes LLE Weight Bearing: Weight bearing as tolerated     Mobility  Bed Mobility Overal bed mobility: Independent                  Transfers Overall transfer level: Independent Equipment used: None                    Ambulation/Gait Ambulation/Gait assistance: Modified independent (Device/Increase time) Gait Distance (Feet): 250 Feet Assistive device: Rolling walker (2 wheels) Gait Pattern/deviations: Step-through pattern, Decreased weight shift to left Gait velocity: decreased      General Gait Details: Good step through pattern with equal stride length and L heel strike at initial contact. Min cues for posture and upward gaze.   Stairs             Wheelchair Mobility    Modified Rankin (Stroke Patients Only)       Balance Overall balance assessment: Mild deficits observed, not formally tested                                          Cognition Arousal/Alertness: Awake/alert Behavior During Therapy: WFL for tasks assessed/performed Overall Cognitive Status: Within Functional Limits for tasks assessed                                          Exercises Total Joint Exercises Long Arc Quad: Left, 5 reps, Seated Knee Flexion: Left, 5 reps, Seated Goniometric ROM: 2-80 degrees    General Comments        Pertinent Vitals/Pain Pain Assessment Pain Assessment: Faces Faces Pain Scale: Hurts a little bit Pain Location: L knee Pain Descriptors / Indicators: Discomfort Pain Intervention(s): Monitored during session    Home Living                          Prior Function            PT Goals (current goals can now be found in the care plan section) Acute Rehab PT Goals Patient Stated  Goal: to go home Potential to Achieve Goals: Good Progress towards PT goals: Progressing toward goals    Frequency    7X/week      PT Plan Current plan remains appropriate    Co-evaluation              AM-PAC PT "6 Clicks" Mobility   Outcome Measure  Help needed turning from your back to your side while in a flat bed without using bedrails?: None Help needed moving from lying on your back to sitting on the side of a flat bed without using bedrails?: None Help needed moving to and from a bed to a chair (including a wheelchair)?: None Help needed standing up from a chair using your arms (e.g., wheelchair or bedside chair)?: None Help needed to walk in hospital room?: None Help needed climbing 3-5 steps  with a railing? : None 6 Click Score: 24    End of Session   Activity Tolerance: Patient tolerated treatment well Patient left: Other (comment) (standing in room (pt modI)) Nurse Communication: Mobility status PT Visit Diagnosis: Other abnormalities of gait and mobility (R26.89);Pain Pain - Right/Left: Left Pain - part of body: Knee     Time: 0755-0807 PT Time Calculation (min) (ACUTE ONLY): 12 min  Charges:  $Gait Training: 8-22 mins                     Wyona Almas, PT, DPT Acute Rehabilitation Services Office [phone]    Deno Etienne 11/26/2021, 8:15 AM
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
## 2021-11-26 NOTE — Progress Notes (Signed)
AVS provided to patient.  Patient verbalized understanding of education.  Verbalized understanding of follow up appointments and medication regimen.  Patient is calm, alert, oriented and awaiting ride.  Patient going home with walker, bedside commode, iceman, and personal possessions.

## 2021-11-26 NOTE — Progress Notes (Signed)
   Subjective: Pain improved today. Worked with PT. Tolerating diet. Voiding. Overall doing well.  Objective:   VITALS:   Vitals:   11/25/21 0025 11/25/21 0428 11/25/21 2000 11/26/21 0434  BP: (!) 178/102 (!) 178/94 (!) 197/100 (!) 171/97  Pulse: 89 81 90 94  Resp: '18 16 18 17  '$ Temp: 100.3 F (37.9 C) 98.5 F (36.9 C) (!) 101.2 F (38.4 C) 98.2 F (36.8 C)  TempSrc: Oral  Oral   SpO2: 96% 97% 100% 93%  Weight:      Height:        Sensation intact distally Intact pulses distally Dorsiflexion/Plantar flexion intact Incision: dressing C/D/I and no drainage   Lab Results  Component Value Date   WBC 7.9 11/25/2021   HGB 10.8 (L) 11/25/2021   HCT 32.9 (L) 11/25/2021   MCV 89.9 11/25/2021   PLT 249 11/25/2021     Assessment/Plan:  2 Days Post-Op   - Expected postop acute blood loss anemia - will monitor for symptoms - Up with PT/OT - DVT ppx - SCDs, ambulation, aspirin - WBAT operative extremity - Pain control - Discharge planning - will keep here for today for pain control and more PT; anticipate d/c home tomorrow around lunch time  [facility] 11/26/2021, 7:04 AM

## 2021-11-27 ENCOUNTER — Other Ambulatory Visit: Payer: Self-pay | Admitting: Physician Assistant

## 2021-12-08 ENCOUNTER — Encounter: Payer: Commercial Managed Care - HMO | Admitting: Orthopaedic Surgery

## 2021-12-12 ENCOUNTER — Ambulatory Visit (INDEPENDENT_AMBULATORY_CARE_PROVIDER_SITE_OTHER): Payer: Commercial Managed Care - HMO | Admitting: Physician Assistant

## 2021-12-12 DIAGNOSIS — Z96652 Presence of left artificial knee joint: Secondary | ICD-10-CM
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
## 2021-12-18 ENCOUNTER — Ambulatory Visit: Payer: Commercial Managed Care - HMO | Attending: Physician Assistant

## 2022-03-06 IMAGING — DX DG ELBOW COMPLETE 3+V*R*
4 series · 4 of 4 positions shown · non-contrast
Comparison: None.

CLINICAL DATA: Right elbow pain.

EXAM:
RIGHT ELBOW - COMPLETE 3+ VIEW

[elbow ap]
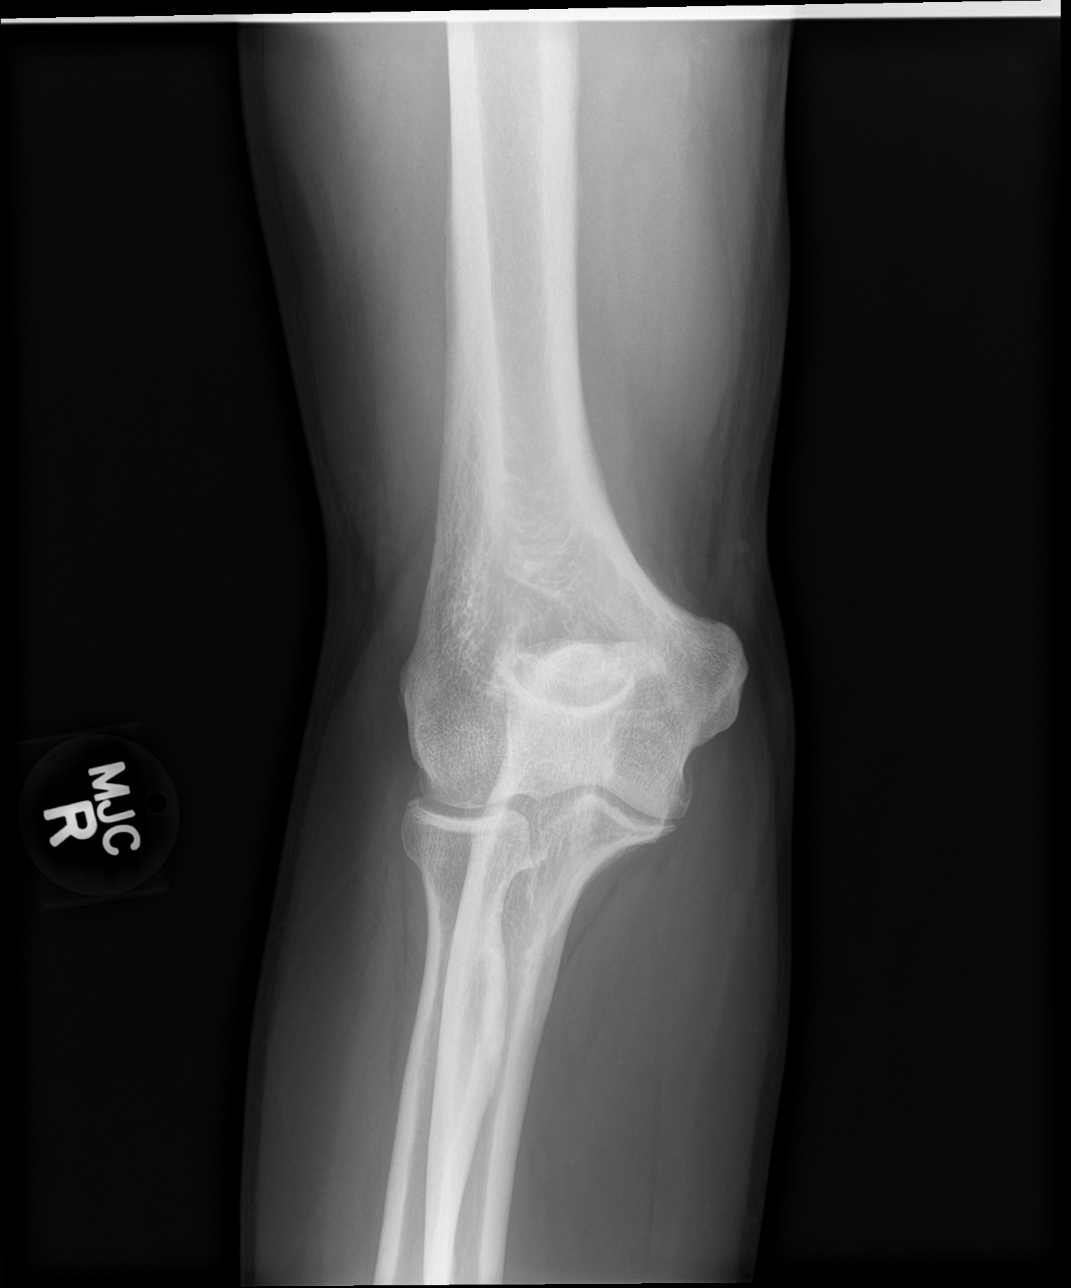

[elbow obl (1 of 2)]
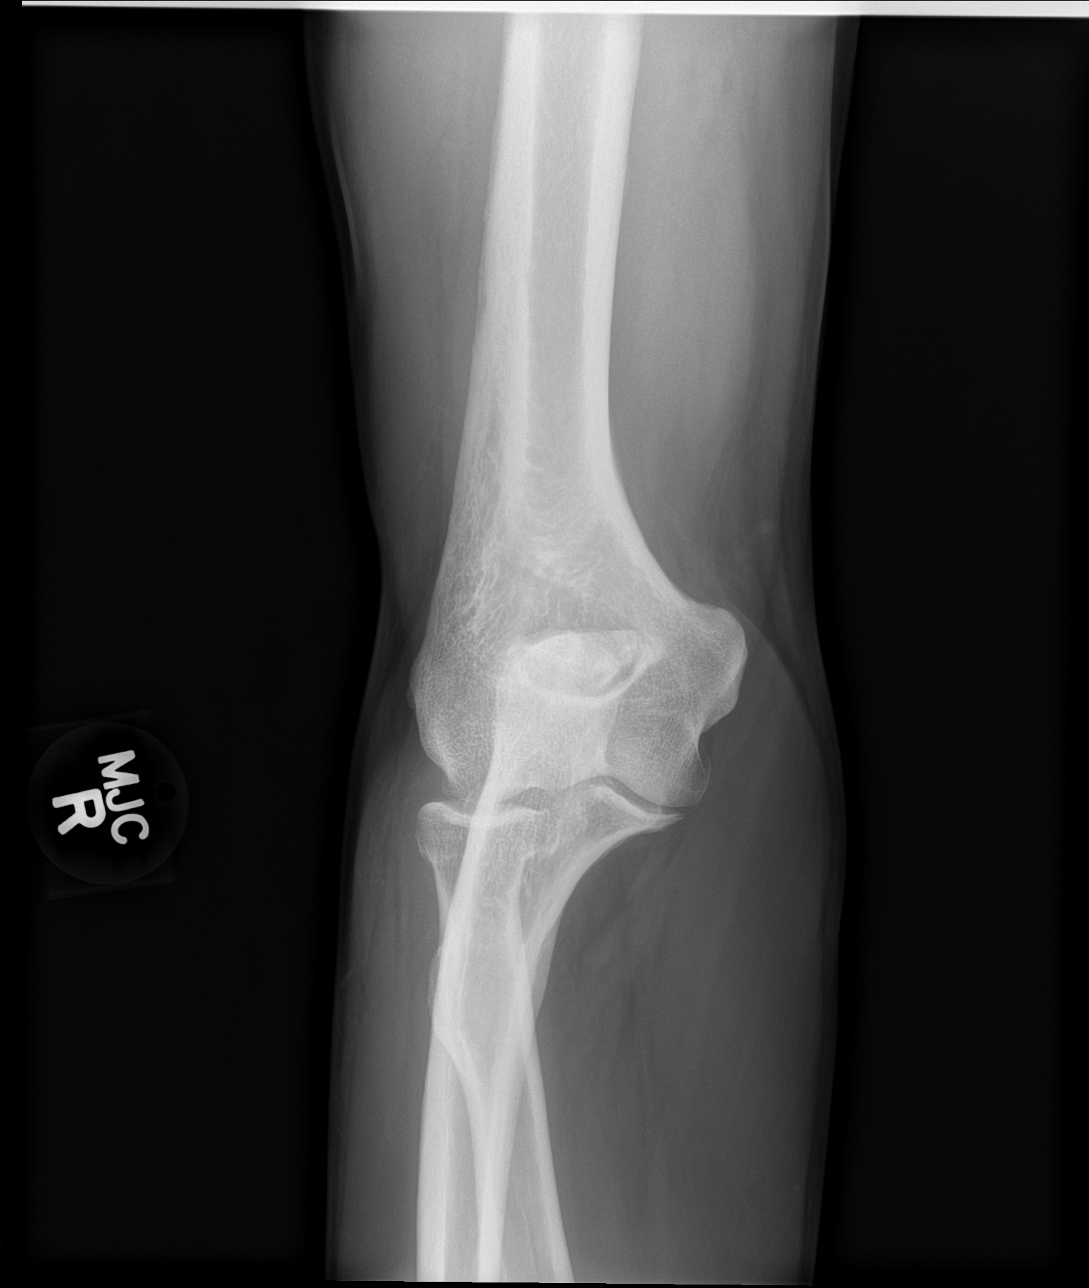

[elbow obl (2 of 2)]
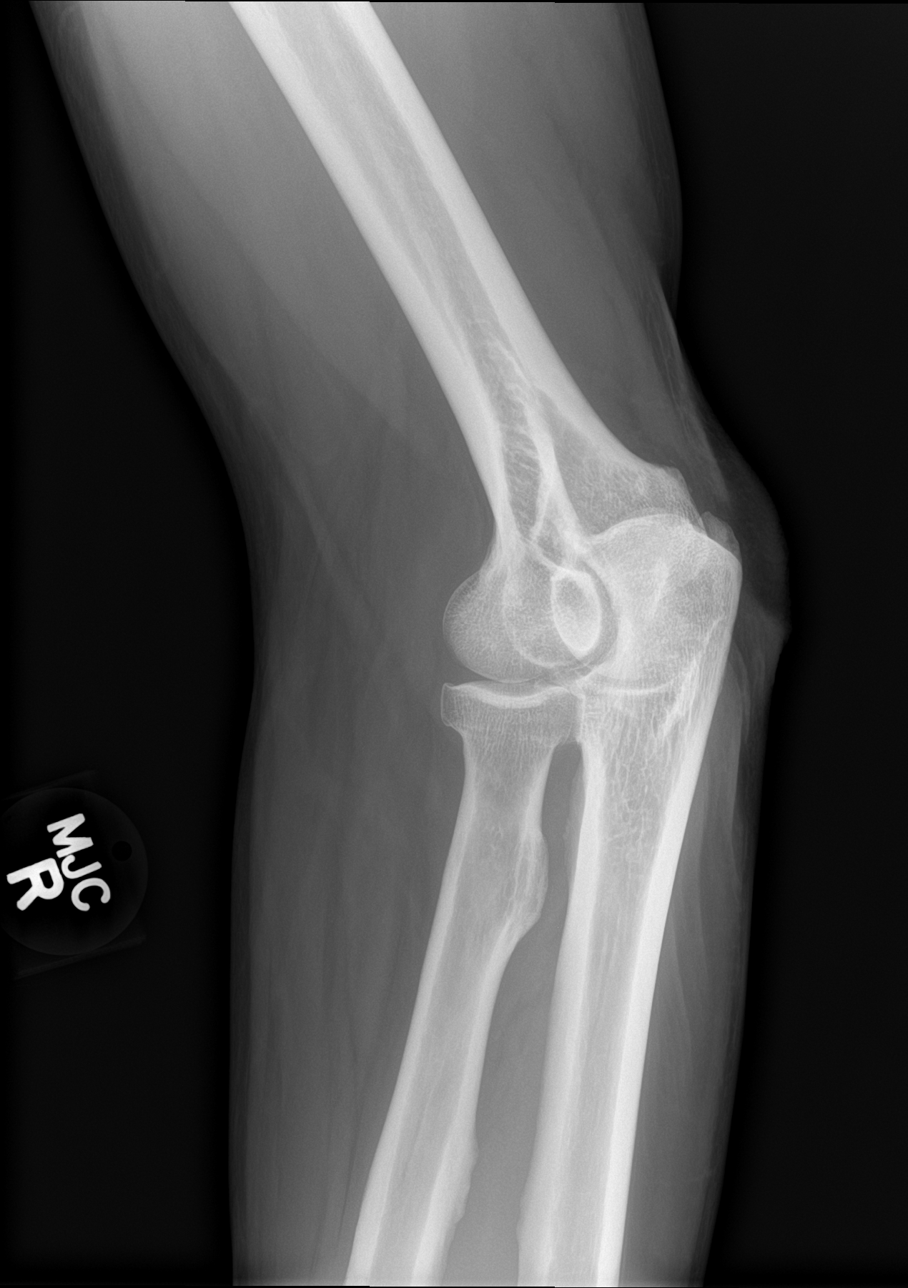

[elbow lat]
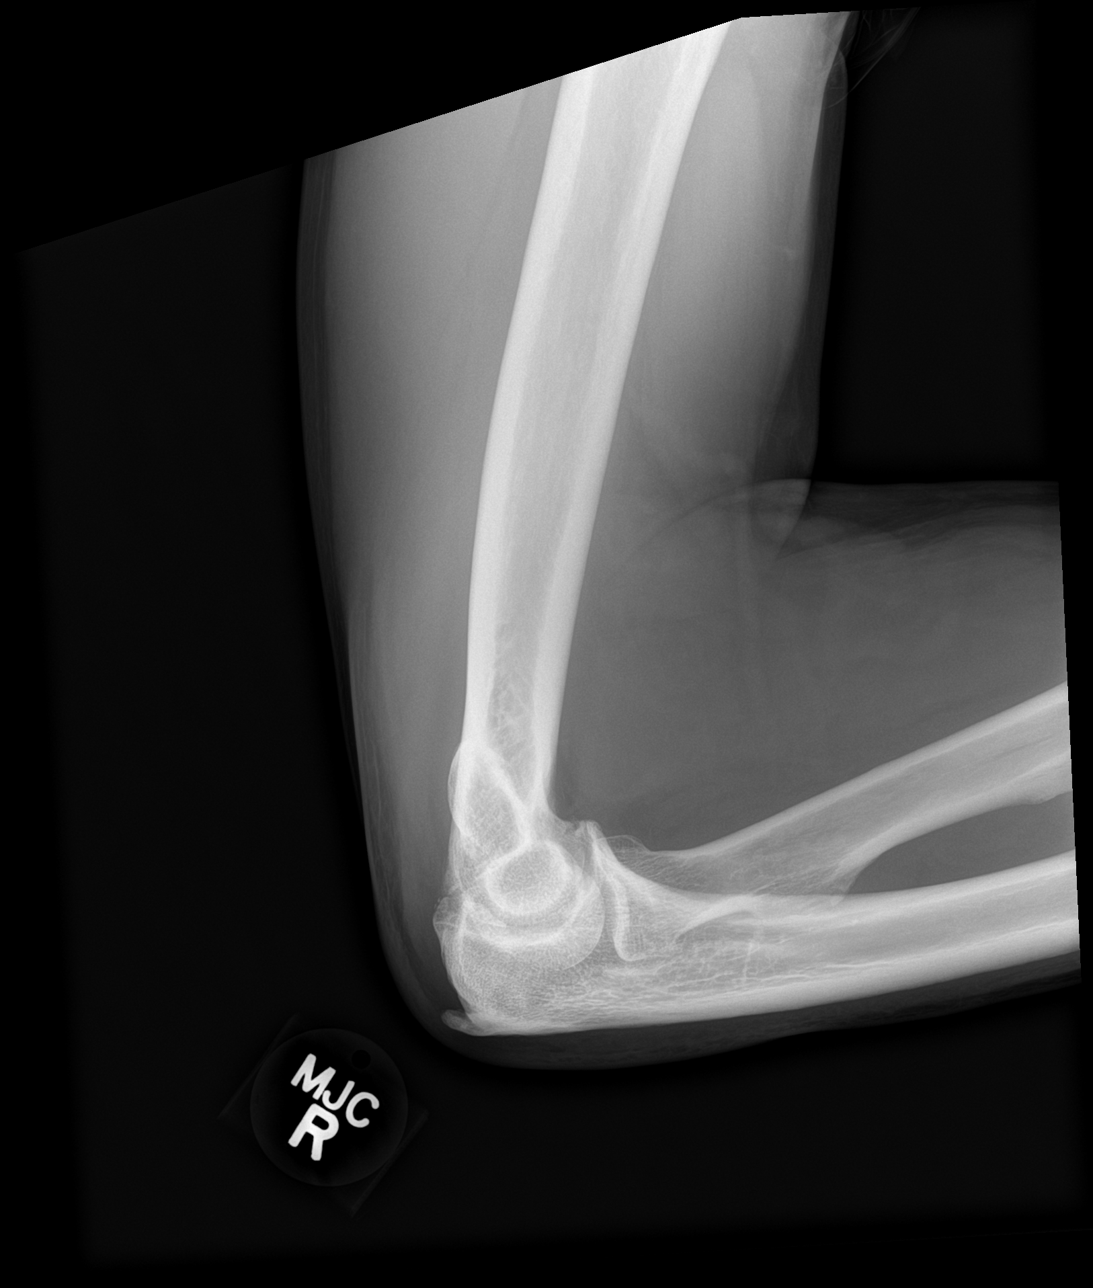

[4 of 4 positions shown; findings below may reference images not displayed]

FINDINGS: There is no evidence of fracture, dislocation, or joint effusion.
There are mild degenerative changes of the elbow joint with
osteophyte formation. Joint spaces are maintained. Olecranon spur is
present. Soft tissues are unremarkable.
IMPRESSION: 1. No acute bony abnormality.
2. Mild degenerative changes.

## 2022-04-02 ENCOUNTER — Other Ambulatory Visit: Payer: Self-pay | Admitting: Physician Assistant

## 2023-03-12 DIAGNOSIS — C61 Malignant neoplasm of prostate: Secondary | ICD-10-CM | POA: Diagnosis not present

## 2023-03-13 DIAGNOSIS — C61 Malignant neoplasm of prostate: Secondary | ICD-10-CM | POA: Diagnosis not present

## 2023-03-14 DIAGNOSIS — C61 Malignant neoplasm of prostate: Secondary | ICD-10-CM | POA: Diagnosis not present

## 2023-03-18 DIAGNOSIS — C61 Malignant neoplasm of prostate: Secondary | ICD-10-CM | POA: Diagnosis not present

## 2023-03-19 DIAGNOSIS — C61 Malignant neoplasm of prostate: Secondary | ICD-10-CM | POA: Diagnosis not present

## 2023-03-20 DIAGNOSIS — C61 Malignant neoplasm of prostate: Secondary | ICD-10-CM | POA: Diagnosis not present

## 2023-03-21 DIAGNOSIS — C61 Malignant neoplasm of prostate: Secondary | ICD-10-CM | POA: Diagnosis not present

## 2023-03-22 DIAGNOSIS — C61 Malignant neoplasm of prostate: Secondary | ICD-10-CM | POA: Diagnosis not present

## 2023-03-26 DIAGNOSIS — C61 Malignant neoplasm of prostate: Secondary | ICD-10-CM | POA: Diagnosis not present

## 2023-03-28 DIAGNOSIS — C61 Malignant neoplasm of prostate: Secondary | ICD-10-CM | POA: Diagnosis not present

## 2023-03-29 DIAGNOSIS — C61 Malignant neoplasm of prostate: Secondary | ICD-10-CM | POA: Diagnosis not present

## 2023-04-02 DIAGNOSIS — C61 Malignant neoplasm of prostate: Secondary | ICD-10-CM | POA: Diagnosis not present

## 2023-04-03 DIAGNOSIS — C61 Malignant neoplasm of prostate: Secondary | ICD-10-CM | POA: Diagnosis not present

## 2023-04-04 DIAGNOSIS — C61 Malignant neoplasm of prostate: Secondary | ICD-10-CM | POA: Diagnosis not present

## 2023-04-05 DIAGNOSIS — C61 Malignant neoplasm of prostate: Secondary | ICD-10-CM | POA: Diagnosis not present

## 2023-04-08 DIAGNOSIS — C61 Malignant neoplasm of prostate: Secondary | ICD-10-CM | POA: Diagnosis not present

## 2023-04-09 DIAGNOSIS — C61 Malignant neoplasm of prostate: Secondary | ICD-10-CM | POA: Diagnosis not present

## 2023-04-11 DIAGNOSIS — C61 Malignant neoplasm of prostate: Secondary | ICD-10-CM | POA: Diagnosis not present

## 2023-04-12 DIAGNOSIS — C61 Malignant neoplasm of prostate: Secondary | ICD-10-CM | POA: Diagnosis not present

## 2023-04-15 DIAGNOSIS — C61 Malignant neoplasm of prostate: Secondary | ICD-10-CM | POA: Diagnosis not present

## 2023-04-17 DIAGNOSIS — C61 Malignant neoplasm of prostate: Secondary | ICD-10-CM | POA: Diagnosis not present

## 2023-07-22 DIAGNOSIS — C61 Malignant neoplasm of prostate: Secondary | ICD-10-CM | POA: Diagnosis not present

## 2023-10-29 ENCOUNTER — Other Ambulatory Visit (INDEPENDENT_AMBULATORY_CARE_PROVIDER_SITE_OTHER): Payer: Self-pay

## 2023-10-29 ENCOUNTER — Ambulatory Visit (INDEPENDENT_AMBULATORY_CARE_PROVIDER_SITE_OTHER): Admitting: Orthopaedic Surgery

## 2023-10-29 DIAGNOSIS — Z96652 Presence of left artificial knee joint: Secondary | ICD-10-CM
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
## 2023-11-05 ENCOUNTER — Ambulatory Visit: Admitting: Orthopaedic Surgery

## 2023-11-19 ENCOUNTER — Ambulatory Visit: Admitting: Orthopaedic Surgery

## 2024-01-27 DIAGNOSIS — C61 Malignant neoplasm of prostate: Secondary | ICD-10-CM | POA: Diagnosis not present

## 2024-01-27 DIAGNOSIS — Z923 Personal history of irradiation: Secondary | ICD-10-CM | POA: Diagnosis not present

## 2024-01-27 DIAGNOSIS — N3941 Urge incontinence: Secondary | ICD-10-CM | POA: Diagnosis not present
